# Patient Record
Sex: Female | Born: 1940 | Race: White | Hispanic: No | Smoking: Former smoker
Health system: Southern US, Community
[De-identification: ages and names within clinical notes are randomized; demographics above are authoritative.]

## PROBLEM LIST (undated history)

## (undated) DIAGNOSIS — I519 Heart disease, unspecified: Secondary | ICD-10-CM

## (undated) DIAGNOSIS — F419 Anxiety disorder, unspecified: Secondary | ICD-10-CM

## (undated) DIAGNOSIS — E7800 Pure hypercholesterolemia, unspecified: Secondary | ICD-10-CM

## (undated) DIAGNOSIS — M1990 Unspecified osteoarthritis, unspecified site: Secondary | ICD-10-CM

## (undated) DIAGNOSIS — I10 Essential (primary) hypertension: Secondary | ICD-10-CM

## (undated) DIAGNOSIS — F32A Depression, unspecified: Secondary | ICD-10-CM

## (undated) DIAGNOSIS — F329 Major depressive disorder, single episode, unspecified: Secondary | ICD-10-CM

## (undated) DIAGNOSIS — K219 Gastro-esophageal reflux disease without esophagitis: Secondary | ICD-10-CM

## (undated) HISTORY — PX: OTHER SURGICAL HISTORY: SHX169

## (undated) HISTORY — PX: FOOT SURGERY: SHX648

## (undated) HISTORY — DX: Heart disease, unspecified: I51.9

## (undated) HISTORY — PX: TONSILLECTOMY: SUR1361

## (undated) HISTORY — PX: CERVICAL LAMINECTOMY: SHX94

## (undated) HISTORY — PX: NECK SURGERY: SHX720

## (undated) HISTORY — PX: EYE SURGERY: SHX253

## (undated) HISTORY — DX: Anxiety disorder, unspecified: F41.9

## (undated) HISTORY — DX: Pure hypercholesterolemia, unspecified: E78.00

---

## 1969-04-18 HISTORY — PX: TONSILLECTOMY: SUR1361

## 1972-08-18 HISTORY — PX: ABDOMINAL HYSTERECTOMY: SHX81

## 1979-04-19 HISTORY — PX: CERVICAL LAMINECTOMY: SHX94

## 1989-04-18 HISTORY — PX: OTHER SURGICAL HISTORY: SHX169

## 2001-08-18 HISTORY — PX: OTHER SURGICAL HISTORY: SHX169

## 2002-11-11 ENCOUNTER — Encounter: Admission: RE | Admit: 2002-11-11 | Discharge: 2002-11-11 | Payer: Self-pay | Admitting: Neurosurgery

## 2002-11-11 ENCOUNTER — Encounter: Payer: Self-pay | Admitting: Neurosurgery

## 2002-12-21 ENCOUNTER — Encounter: Admission: RE | Admit: 2002-12-21 | Discharge: 2002-12-21 | Payer: Self-pay | Admitting: General Surgery

## 2002-12-21 ENCOUNTER — Encounter: Payer: Self-pay | Admitting: General Surgery

## 2003-04-11 ENCOUNTER — Encounter: Admission: RE | Admit: 2003-04-11 | Discharge: 2003-07-10 | Payer: Self-pay | Admitting: Orthopedic Surgery

## 2004-06-20 ENCOUNTER — Ambulatory Visit (HOSPITAL_BASED_OUTPATIENT_CLINIC_OR_DEPARTMENT_OTHER): Admission: RE | Admit: 2004-06-20 | Discharge: 2004-06-20 | Payer: Self-pay | Admitting: Orthopedic Surgery

## 2004-06-20 ENCOUNTER — Ambulatory Visit (HOSPITAL_COMMUNITY): Admission: RE | Admit: 2004-06-20 | Discharge: 2004-06-20 | Payer: Self-pay | Admitting: Orthopedic Surgery

## 2004-07-09 ENCOUNTER — Encounter: Admission: RE | Admit: 2004-07-09 | Discharge: 2004-10-07 | Payer: Self-pay | Admitting: Orthopedic Surgery

## 2004-07-22 ENCOUNTER — Ambulatory Visit: Payer: Self-pay | Admitting: Family Medicine

## 2005-03-04 ENCOUNTER — Ambulatory Visit: Payer: Self-pay | Admitting: Family Medicine

## 2005-06-11 ENCOUNTER — Ambulatory Visit: Payer: Self-pay | Admitting: Family Medicine

## 2005-06-13 ENCOUNTER — Ambulatory Visit: Payer: Self-pay | Admitting: Family Medicine

## 2005-10-16 ENCOUNTER — Ambulatory Visit: Payer: Self-pay | Admitting: Family Medicine

## 2006-12-18 ENCOUNTER — Ambulatory Visit (HOSPITAL_COMMUNITY): Payer: Self-pay | Admitting: Psychiatry

## 2007-02-04 ENCOUNTER — Ambulatory Visit (HOSPITAL_BASED_OUTPATIENT_CLINIC_OR_DEPARTMENT_OTHER): Admission: RE | Admit: 2007-02-04 | Discharge: 2007-02-04 | Payer: Self-pay | Admitting: Orthopedic Surgery

## 2007-02-10 ENCOUNTER — Ambulatory Visit (HOSPITAL_COMMUNITY): Payer: Self-pay | Admitting: Psychiatry

## 2007-02-22 ENCOUNTER — Encounter: Admission: RE | Admit: 2007-02-22 | Discharge: 2007-03-22 | Payer: Self-pay | Admitting: Orthopedic Surgery

## 2007-03-17 ENCOUNTER — Ambulatory Visit (HOSPITAL_COMMUNITY): Payer: Self-pay | Admitting: Licensed Clinical Social Worker

## 2008-10-30 ENCOUNTER — Ambulatory Visit: Payer: Self-pay | Admitting: Gastroenterology

## 2008-11-13 ENCOUNTER — Ambulatory Visit: Payer: Self-pay | Admitting: Gastroenterology

## 2009-04-27 ENCOUNTER — Ambulatory Visit (HOSPITAL_COMMUNITY): Payer: Self-pay | Admitting: Licensed Clinical Social Worker

## 2009-05-14 ENCOUNTER — Ambulatory Visit (HOSPITAL_COMMUNITY): Payer: Self-pay | Admitting: Licensed Clinical Social Worker

## 2009-05-25 ENCOUNTER — Ambulatory Visit (HOSPITAL_COMMUNITY): Payer: Self-pay | Admitting: Psychiatry

## 2009-06-01 ENCOUNTER — Ambulatory Visit (HOSPITAL_COMMUNITY): Payer: Self-pay | Admitting: Licensed Clinical Social Worker

## 2009-06-15 ENCOUNTER — Ambulatory Visit (HOSPITAL_COMMUNITY): Payer: Self-pay | Admitting: Licensed Clinical Social Worker

## 2009-06-26 ENCOUNTER — Ambulatory Visit (HOSPITAL_COMMUNITY): Payer: Self-pay | Admitting: Psychiatry

## 2009-07-02 ENCOUNTER — Ambulatory Visit (HOSPITAL_COMMUNITY): Payer: Self-pay | Admitting: Licensed Clinical Social Worker

## 2009-07-25 ENCOUNTER — Ambulatory Visit (HOSPITAL_COMMUNITY): Payer: Self-pay | Admitting: Psychiatry

## 2009-08-09 ENCOUNTER — Ambulatory Visit (HOSPITAL_COMMUNITY): Payer: Self-pay | Admitting: Licensed Clinical Social Worker

## 2009-08-22 ENCOUNTER — Ambulatory Visit (HOSPITAL_COMMUNITY): Payer: Self-pay | Admitting: Psychiatry

## 2009-09-03 ENCOUNTER — Ambulatory Visit (HOSPITAL_COMMUNITY): Payer: Self-pay | Admitting: Licensed Clinical Social Worker

## 2009-09-18 ENCOUNTER — Ambulatory Visit (HOSPITAL_COMMUNITY): Payer: Self-pay | Admitting: Licensed Clinical Social Worker

## 2009-10-08 ENCOUNTER — Ambulatory Visit (HOSPITAL_COMMUNITY): Payer: Self-pay | Admitting: Licensed Clinical Social Worker

## 2009-10-22 ENCOUNTER — Ambulatory Visit (HOSPITAL_COMMUNITY): Payer: Self-pay | Admitting: Licensed Clinical Social Worker

## 2009-10-26 ENCOUNTER — Ambulatory Visit (HOSPITAL_COMMUNITY): Payer: Self-pay | Admitting: Psychiatry

## 2009-11-06 ENCOUNTER — Ambulatory Visit (HOSPITAL_COMMUNITY): Payer: Self-pay | Admitting: Licensed Clinical Social Worker

## 2009-11-21 ENCOUNTER — Ambulatory Visit (HOSPITAL_COMMUNITY): Payer: Self-pay | Admitting: Licensed Clinical Social Worker

## 2009-11-27 ENCOUNTER — Ambulatory Visit (HOSPITAL_COMMUNITY): Payer: Self-pay | Admitting: Physician Assistant

## 2009-11-28 ENCOUNTER — Ambulatory Visit (HOSPITAL_COMMUNITY): Payer: Self-pay | Admitting: Licensed Clinical Social Worker

## 2009-12-21 ENCOUNTER — Ambulatory Visit (HOSPITAL_COMMUNITY): Payer: Self-pay | Admitting: Psychiatry

## 2010-04-17 ENCOUNTER — Ambulatory Visit (HOSPITAL_COMMUNITY): Payer: Self-pay | Admitting: Psychiatry

## 2010-08-18 HISTORY — PX: KNEE ARTHROSCOPY: SUR90

## 2010-08-21 ENCOUNTER — Ambulatory Visit (HOSPITAL_COMMUNITY)
Admission: RE | Admit: 2010-08-21 | Discharge: 2010-08-21 | Payer: Self-pay | Source: Home / Self Care | Attending: Licensed Clinical Social Worker | Admitting: Licensed Clinical Social Worker

## 2010-08-21 ENCOUNTER — Ambulatory Visit (HOSPITAL_COMMUNITY): Admit: 2010-08-21 | Payer: Self-pay | Admitting: Licensed Clinical Social Worker

## 2010-08-30 ENCOUNTER — Ambulatory Visit (HOSPITAL_COMMUNITY)
Admission: RE | Admit: 2010-08-30 | Discharge: 2010-08-30 | Payer: Self-pay | Source: Home / Self Care | Attending: Psychiatry | Admitting: Psychiatry

## 2010-09-05 ENCOUNTER — Ambulatory Visit (HOSPITAL_COMMUNITY): Admit: 2010-09-05 | Payer: Self-pay | Admitting: Licensed Clinical Social Worker

## 2010-09-06 ENCOUNTER — Ambulatory Visit (HOSPITAL_COMMUNITY)
Admission: RE | Admit: 2010-09-06 | Discharge: 2010-09-06 | Payer: Self-pay | Source: Home / Self Care | Attending: Licensed Clinical Social Worker | Admitting: Licensed Clinical Social Worker

## 2010-09-11 ENCOUNTER — Ambulatory Visit (HOSPITAL_COMMUNITY)
Admission: RE | Admit: 2010-09-11 | Discharge: 2010-09-11 | Payer: Self-pay | Source: Home / Self Care | Attending: Licensed Clinical Social Worker | Admitting: Licensed Clinical Social Worker

## 2010-09-16 ENCOUNTER — Ambulatory Visit (HOSPITAL_COMMUNITY): Admit: 2010-09-16 | Payer: Self-pay | Admitting: Licensed Clinical Social Worker

## 2010-09-17 ENCOUNTER — Ambulatory Visit (HOSPITAL_COMMUNITY)
Admission: RE | Admit: 2010-09-17 | Discharge: 2010-09-17 | Payer: Self-pay | Source: Home / Self Care | Attending: Licensed Clinical Social Worker | Admitting: Licensed Clinical Social Worker

## 2010-09-23 ENCOUNTER — Encounter (HOSPITAL_COMMUNITY): Payer: Medicare Other | Admitting: Licensed Clinical Social Worker

## 2010-10-01 ENCOUNTER — Encounter (HOSPITAL_COMMUNITY): Payer: Medicare Other | Admitting: Licensed Clinical Social Worker

## 2010-10-01 DIAGNOSIS — F332 Major depressive disorder, recurrent severe without psychotic features: Secondary | ICD-10-CM

## 2010-10-07 ENCOUNTER — Encounter (HOSPITAL_COMMUNITY): Payer: Self-pay | Admitting: Licensed Clinical Social Worker

## 2010-10-10 ENCOUNTER — Encounter (INDEPENDENT_AMBULATORY_CARE_PROVIDER_SITE_OTHER): Payer: Medicare Other | Admitting: Licensed Clinical Social Worker

## 2010-10-10 DIAGNOSIS — F332 Major depressive disorder, recurrent severe without psychotic features: Secondary | ICD-10-CM

## 2010-10-30 ENCOUNTER — Encounter (HOSPITAL_COMMUNITY): Payer: Medicare Other | Admitting: Licensed Clinical Social Worker

## 2010-10-30 DIAGNOSIS — F332 Major depressive disorder, recurrent severe without psychotic features: Secondary | ICD-10-CM

## 2010-11-11 ENCOUNTER — Encounter (HOSPITAL_COMMUNITY): Payer: BC Managed Care – PPO | Admitting: Licensed Clinical Social Worker

## 2010-11-11 DIAGNOSIS — F332 Major depressive disorder, recurrent severe without psychotic features: Secondary | ICD-10-CM

## 2010-11-18 ENCOUNTER — Encounter (HOSPITAL_COMMUNITY): Payer: BC Managed Care – PPO | Admitting: Licensed Clinical Social Worker

## 2010-11-19 ENCOUNTER — Encounter (HOSPITAL_COMMUNITY): Payer: Self-pay | Admitting: Physician Assistant

## 2010-11-27 ENCOUNTER — Encounter (HOSPITAL_COMMUNITY): Payer: BC Managed Care – PPO | Admitting: Licensed Clinical Social Worker

## 2010-11-27 DIAGNOSIS — F332 Major depressive disorder, recurrent severe without psychotic features: Secondary | ICD-10-CM

## 2010-12-11 ENCOUNTER — Encounter (HOSPITAL_COMMUNITY): Payer: BC Managed Care – PPO | Admitting: Licensed Clinical Social Worker

## 2010-12-25 ENCOUNTER — Encounter (HOSPITAL_COMMUNITY): Payer: BC Managed Care – PPO | Admitting: Licensed Clinical Social Worker

## 2010-12-31 NOTE — Op Note (Signed)
NAME:  LENIYAH, MARTELL             ACCOUNT NO.:  [id number]   MEDICAL RECORD NO.:  [id number]          PATIENT TYPE:  AMB   LOCATION:  DSC                          FACILITY:  [facility]   PHYSICIAN:  Rodney A. Mortenson, M.D.DATE OF BIRTH:  [birth year]   DATE OF PROCEDURE:  02/04/2007  DATE OF DISCHARGE:                               OPERATIVE REPORT   JUSTIFICATION:  A 70 year old female with a 82-month history of pain  about the left knee.  Activity increase her pain and discomfort.  No  injury.  A slight valgus deformity in the knee, 1+ effusion, acute  tenderness along the medial joint line especially at the posterior  medial corner.  Forward leg extension is very painful.  Weightbearing  film shows some narrowing in the lateral compartment and small marginal  osteophytes at tibial plateau.  Because of persistent pain and  discomfort, it is felt that arthroscopic evaluation and treatment was  indicated necessary.  Complications discussed preoperatively.  Questions  answered and encouraged.   JUSTIFICATION OF OUTPATIENT SURGERY:  Minimum morbidity.   PREOPERATIVE DIAGNOSIS:  Internal derangement, left knee.   POSTOPERATIVE DIAGNOSIS:  Complex tear posterior horn medial meniscus;  tear posterior horn lateral meniscus; osteoarthritis lateral tibial  plateau; chondromalacia of patella.   OPERATION:  Partial medial and lateral meniscectomy, left knee;  chondroplasty of left patella.   SURGEON:  Lenard Galloway. Chaney Malling, M.D.   ANESTHESIA:  Monitored anesthesia care followed by general.   PATHOLOGY:  We arthroscoped the knee.  A very careful examination of the  knee was undertaken.  There was grade 2 and grade 3 chondromalacia of  the patella but the femoral notch appeared fairly normal.  In the  chondral notch area, the ACL was intact.  In the lateral compartment,  there was a tear of the posterior horn of the lateral meniscus and  underneath the mid 1/3 of the lateral meniscus, there  was an area of  total loss of articular cartilage.  In the medial compartments, a  complex tear of the posterior horn of the medial meniscus.   PROCEDURE:  The patient was placed on the operative table in the supine  position with the pneumatic tourniquet brought up the left upper thigh.  The entire left lower extremity prepped with DuraPrep and draped out in  the usual manner.  An infusion cannula was placed in the superior medial  pouch and knee distended with saline.  Anterior-medial and anterior-  lateral portals were made and the arthroscope was introduced.   Attention was first turned to the patella.  Using a chondroplastic shear  over the posterior aspect of the patella, both the medial and lateral  patella recess was debrided with fairly stable cartilage.   Attention then turned to the lateral compartment.  There was a tear of  the posterior horn lateral meniscus, which was debrided with baskets  through both medial and lateral portals.  This was followed by the  intraarticular shaver and this portion of the meniscus was then smoothed  and balanced.  Underneath the mid 1/3 of the lateral meniscus, there was  flaking  and tearing of the articular cartilage off the tibial plateau  and this loose cartilage was then removed.   Attention then turned to the medial compartment.  There was a very  complex tear of the posterior horn of the medial meniscus and this was  debrided through both portals with a series of baskets followed by the  intraarticular shear.  Excellent decompression was also achieved in this  area with nice transition to fairly normal meniscus in the mid 1/3 of  the medial meniscus.  The knee was then filled with Marcaine.  A large,  bulky compression dressing applied.  The patient returned to the  recovery room in excellent condition, checked and this went extremely  well.   DISPOSITION:  1. Percocet for pain.  2. Return to my office on Wednesday.  3. Usual  postoperative instructions were given.           ______________________________  Lenard Galloway. Chaney Malling, M.D.     RAM/MEDQ  D:  02/04/2007  T:  02/04/2007  Job:  [id number]

## 2011-01-03 NOTE — Op Note (Signed)
NAME:  Maria George, Maria George             ACCOUNT NO.:  [id number]   MEDICAL RECORD NO.:  [id number]          PATIENT TYPE:  AMB   LOCATION:  DSC                          FACILITY:  [facility]   PHYSICIAN:  Rodney A. Mortenson, M.D.DATE OF BIRTH:  [birth year]   DATE OF PROCEDURE:  06/20/2004  DATE OF DISCHARGE:                                 OPERATIVE REPORT   PREOPERATIVE DIAGNOSIS:  Massive rotator cuff tear, left shoulder, with 80%  uncoverage.   POSTOPERATIVE DIAGNOSIS:  Massive rotator cuff tear, left shoulder, with 80%  uncoverage of the humeral head.   OPERATION:  Diagnostic arthroscopy of the left shoulder; open acromioplasty;  side-to-side repair of massive tear rotator cuff left shoulder.   SURGEON:  Lenard Galloway. Chaney Malling, M.D.   ASSISTANT:  Joan Mayans   ANESTHESIA:  General.   PROCEDURE:  The patient was placed on the operating table in supine  position.  After satisfactory oral endotracheal anesthesia, the patient was  placed in a semi-seated position.  The left upper extremity was prepped with  DuraPrep and draped out in the usual manner.  Through the standard posterior  portal, the arthroscope was introduced.  A very careful examination of the  glenohumeral joint was done.  There was a massive tear of the rotator cuff  and from the glenohumeral joint, one could observe the subacromial space.  The biceps was frayed in the mid portion but its attachment was intact.  The  articular cartilage over the humeral head and the glenoid appeared normal as  did the anterior glenoid labrum.  There was a massive tear of the rotator  cuff and this was retracted proximally to the lip of the glenoid medially.  The arthroscope was removed.   A saber cut incision was made over the anterolateral aspect of the shoulder.  The skin edges were retracted, bleeders were coagulated.  The deltoid fibers  were released off the anterior aspect of the acromion only.  The subacromial  bursa was excised.   Using a power saw, a small acromioplasty was done which  gave better access to the shoulder joint.  Once this was accomplished, the  bursa was removed.  There was a massive tear of the rotator cuff and about  80% or the head was uncovered.  There were two leaves to the rotator cuff  anterior and posterior which opened up in a V-fashion.  The edges of the  rotator cuff could be mobilized to the midline and excellent coverage of the  head was achieved.  Using a series of interrupted mattress sutures starting  distal and working proximal, the entire tear was brought side-to-side and  excellent water tight closure was achieved.  There was good integrity of the  tissues once this was accomplished.  Throughout the procedure, the shoulder  was copiously irrigated with saline solution.  The deltoid fibers were then  reattached with 0 Vicryl, 2-0 Vicryl was used to close the subcutaneous  tissue, and stainless steel staples were used to close the skin.  A sterile  dressing was applied.  The patient returned to the recovery room in  excellent condition.  Technically, this procedure went extremely well.  I  was very pleased with the surgical outcome.   FOLLOW UP CARE:  1.  [facility].  2.  To my office on Wednesday.  3.  Sling left arm.  4.  Percocet for pain.       RAM/MEDQ  D:  06/20/2004  T:  06/20/2004  Job:  [id number]

## 2011-01-16 ENCOUNTER — Encounter (HOSPITAL_COMMUNITY): Payer: Medicare Other | Admitting: Physician Assistant

## 2011-01-16 DIAGNOSIS — F332 Major depressive disorder, recurrent severe without psychotic features: Secondary | ICD-10-CM

## 2011-01-22 ENCOUNTER — Encounter (HOSPITAL_COMMUNITY): Payer: Medicare Other | Admitting: Physician Assistant

## 2011-01-30 ENCOUNTER — Encounter (HOSPITAL_COMMUNITY): Payer: Medicare Other | Admitting: Physician Assistant

## 2011-01-31 ENCOUNTER — Encounter (HOSPITAL_COMMUNITY): Payer: Medicare Other | Admitting: Physician Assistant

## 2011-04-01 ENCOUNTER — Encounter (HOSPITAL_COMMUNITY): Payer: Medicare Other | Admitting: Physician Assistant

## 2011-04-19 HISTORY — PX: TOTAL KNEE ARTHROPLASTY: SHX125

## 2011-04-28 ENCOUNTER — Other Ambulatory Visit: Payer: Self-pay | Admitting: Orthopedic Surgery

## 2011-04-28 ENCOUNTER — Encounter (HOSPITAL_COMMUNITY): Payer: Medicare Other

## 2011-04-28 LAB — CBC
HCT: 39.3 % (ref 36.0–46.0)
Hemoglobin: 13.2 g/dL (ref 12.0–15.0)
MCH: 31.7 pg (ref 26.0–34.0)
MCHC: 33.6 g/dL (ref 30.0–36.0)
MCV: 94.2 fL (ref 78.0–100.0)
Platelets: 262 10*3/uL (ref 150–400)
RBC: 4.17 MIL/uL (ref 3.87–5.11)
RDW: 12.4 % (ref 11.5–15.5)
WBC: 8.9 10*3/uL (ref 4.0–10.5)

## 2011-04-28 LAB — URINALYSIS, ROUTINE W REFLEX MICROSCOPIC
Bilirubin Urine: NEGATIVE
Glucose, UA: NEGATIVE mg/dL
Hgb urine dipstick: NEGATIVE
Ketones, ur: NEGATIVE mg/dL
Leukocytes, UA: NEGATIVE
Nitrite: NEGATIVE
Protein, ur: NEGATIVE mg/dL
Specific Gravity, Urine: 1.014 (ref 1.005–1.030)
Urobilinogen, UA: 0.2 mg/dL (ref 0.0–1.0)
pH: 6.5 (ref 5.0–8.0)

## 2011-04-28 LAB — COMPREHENSIVE METABOLIC PANEL
ALT: 27 U/L (ref 0–35)
AST: 24 U/L (ref 0–37)
Albumin: 3.7 g/dL (ref 3.5–5.2)
Alkaline Phosphatase: 103 U/L (ref 39–117)
CO2: 31 mEq/L (ref 19–32)
Calcium: 9.9 mg/dL (ref 8.4–10.5)
Chloride: 98 mEq/L (ref 96–112)
Creatinine, Ser: 0.97 mg/dL (ref 0.50–1.10)
GFR calc Af Amer: >60 mL/min (ref >60)
GFR calc non Af Amer: 57 mL/min — ABNORMAL LOW (ref >60)
Glucose, Bld: 80 mg/dL (ref 70–99)
Potassium: 4.3 mEq/L (ref 3.5–5.1)
Sodium: 135 mEq/L (ref 135–145)
Total Bilirubin: 0.2 mg/dL — ABNORMAL LOW (ref 0.3–1.2)
Total Protein: 7.3 g/dL (ref 6.0–8.3)

## 2011-04-28 LAB — PROTIME-INR: INR: 0.9 (ref 0.00–1.49)

## 2011-04-28 LAB — SURGICAL PCR SCREEN
MRSA, PCR: NEGATIVE
Staphylococcus aureus: NEGATIVE

## 2011-05-05 ENCOUNTER — Inpatient Hospital Stay (HOSPITAL_COMMUNITY)
Admission: RE | Admit: 2011-05-05 | Discharge: 2011-05-08 | DRG: 470 | Disposition: A | Discharge status: Skilled Nursing Facility | Payer: Medicare Other | Source: Ambulatory Visit | Attending: Orthopedic Surgery | Admitting: Orthopedic Surgery

## 2011-05-05 DIAGNOSIS — F329 Major depressive disorder, single episode, unspecified: Secondary | ICD-10-CM | POA: Diagnosis present

## 2011-05-05 DIAGNOSIS — Z79899 Other long term (current) drug therapy: Secondary | ICD-10-CM

## 2011-05-05 DIAGNOSIS — D62 Acute posthemorrhagic anemia: Secondary | ICD-10-CM | POA: Diagnosis not present

## 2011-05-05 DIAGNOSIS — F3289 Other specified depressive episodes: Secondary | ICD-10-CM | POA: Diagnosis present

## 2011-05-05 DIAGNOSIS — I10 Essential (primary) hypertension: Secondary | ICD-10-CM | POA: Diagnosis present

## 2011-05-05 DIAGNOSIS — M810 Age-related osteoporosis without current pathological fracture: Secondary | ICD-10-CM | POA: Diagnosis present

## 2011-05-05 DIAGNOSIS — E871 Hypo-osmolality and hyponatremia: Secondary | ICD-10-CM | POA: Diagnosis not present

## 2011-05-05 DIAGNOSIS — M1710 Unilateral primary osteoarthritis, unspecified knee: Principal | ICD-10-CM | POA: Diagnosis present

## 2011-05-05 DIAGNOSIS — Z01812 Encounter for preprocedural laboratory examination: Secondary | ICD-10-CM

## 2011-05-05 DIAGNOSIS — IMO0002 Reserved for concepts with insufficient information to code with codable children: Secondary | ICD-10-CM | POA: Diagnosis present

## 2011-05-05 LAB — TYPE AND SCREEN

## 2011-05-06 LAB — BASIC METABOLIC PANEL
CO2: 28 mEq/L (ref 19–32)
Chloride: 98 mEq/L (ref 96–112)
Glucose, Bld: 134 mg/dL — ABNORMAL HIGH (ref 70–99)
Potassium: 4.8 mEq/L (ref 3.5–5.1)
Sodium: 134 mEq/L — ABNORMAL LOW (ref 135–145)

## 2011-05-06 LAB — CBC
Hemoglobin: 10.1 g/dL — ABNORMAL LOW (ref 12.0–15.0)
MCH: 31.9 pg (ref 26.0–34.0)
RBC: 3.17 MIL/uL — ABNORMAL LOW (ref 3.87–5.11)

## 2011-05-07 LAB — BASIC METABOLIC PANEL
BUN: 15 mg/dL (ref 6–23)
Chloride: 103 mEq/L (ref 96–112)
GFR calc Af Amer: >60 mL/min (ref >60)
Glucose, Bld: 113 mg/dL — ABNORMAL HIGH (ref 70–99)
Potassium: 3.9 mEq/L (ref 3.5–5.1)
Sodium: 136 mEq/L (ref 135–145)

## 2011-05-07 LAB — CBC
HCT: 27 % — ABNORMAL LOW (ref 36.0–46.0)
Hemoglobin: 9 g/dL — ABNORMAL LOW (ref 12.0–15.0)
RDW: 13.2 % (ref 11.5–15.5)
WBC: 10.2 10*3/uL (ref 4.0–10.5)

## 2011-05-07 NOTE — Op Note (Signed)
NAMESHERLYN, EBBERT NO.:  [id number]  MEDICAL RECORD NO.:  [id number]  LOCATION:  1605                         FACILITY:  [facility]  PHYSICIAN:  Ollen Gross, M.D.    DATE OF BIRTH:  24-Aug-1940  DATE OF PROCEDURE:  05/05/2011 DATE OF DISCHARGE:                              OPERATIVE REPORT   PREOPERATIVE DIAGNOSIS:  Osteoarthritis, left knee.  POSTOPERATIVE DIAGNOSIS:  Osteoarthritis, left knee.  PROCEDURE:  Left total knee arthroplasty.  SURGEON:  Ollen Gross, M.D.  ASSISTANT:  Alexzandrew L. Julien Girt, PA-C  ANESTHESIA:  General.  ESTIMATED BLOOD LOSS:  Minimal.  DRAIN:  Hemovac x1.  TOURNIQUET TIME:  34 minutes at 300 mmHg.  COMPLICATIONS:  None.  CONDITION:  Stable, recovering.  BRIEF CLINICAL NOTE:  [name] is a 70 year old female with advanced end-stage arthritis of the left knee with progressively worsening pain and dysfunction.  She has failed nonoperative management including injections, hence she presents for left total knee arthroplasty.  PROCEDURE IN DETAIL:  After successful administration of general anesthetic, a tourniquet placed high on her left thigh on her left lower extremity, was prepped and draped in usual sterile fashion.  Extremities wrapped in Esmarch, knee flexed, tourniquet inflated to 300 mmHg. Midline incision was made with a #10 blade through subcutaneous tissue to the level of the extensor mechanism.  A fresh blade is used make a medial parapatellar arthrotomy.  Soft tissue on the proximal medial tibia is subperiosteally elevated to the joint line with a knife into the semimembranosus bursa with a Cobb elevator.  Soft tissue laterally was elevated with attention being paid to avoid the patellar tendon and tibial tubercle.  Patella was everted, knee flexed 90 degrees, ACL and PCL removed.  There was bone-on-bone change in the lateral compartment and significant chondromalacia medial and patellofemoral with  cartilage loss.  Drill was used to create a starting hole in the distal femur and canal was thoroughly irrigated.  The 5-degree left valgus alignment guide was placed and distal femoral cutting block pinned to remove 10 mm off the distal femur.  Distal femoral resection is made with an oscillating saw.  Tibia subluxed forward and the menisci removed.  Extramedullary tibial alignment guide is placed referencing proximally to medial aspect of the tibial tubercle and distally along the second metatarsal axis and tibial crest.  The block is pinned to remove 2 mm off the more deficient lateral side.  Tibial resection is made with an oscillating saw.  A size 2.5 is most appropriate and the proximal tibia was repaired with a modular drill and keel punch for the size 2.5.  Femoral sizing guide was placed, size 3 is most appropriate.  Rotation was marked to the epicondylar axis and confirmed by creating rectangular flexion gap at 90 degrees.  The block is pinned in its rotation and the anterior-posterior chamfer cuts were made.  Intercondylar block was placed in that, cuts made.  Trial size 3 posterior stabilized femoral component is placed.  12.5 mm posterior stabilized rotating platform insert trial was placed.  With a 12.5, full extensions achieved with excellent varus-valgus and anterior-posterior balance throughout full range of motion.  The patella was everted, thickness measured  to be 22 mm.  Freehand resection is taken to 13 mm, 35 template is placed, lug holes were drilled, trial patella was placed and it tracks normally. Osteophytes removed off the posterior femur with the trial in place. All trials are removed and the cut bone surfaces repaired with pulsatile lavage.  Cements mixed and once ready for implantation, a size 2.5 mobile bearing tibial tray size 3, posterior stabilized femur and 35 patella are cemented into place.  Patella was held with a clamp.  The trial 12.5 mm insert  is placed, knee held in full extension, all extruded cement removed.  When the cement is fully hardened, then the permanent 12.5 mm posterior stabilized rotating platform insert is placed in the tibial tray.  Wound is copiously irrigated with saline solution and the arthrotomy closed over Hemovac drain with interrupted #1 PDS.  Flexion against gravity to 135 degrees of patella tracks normally.  Tourniquet was released, total time of 34 minutes.  Subcu was closed with interrupted 2-0 Vicryl, subcuticular running 4-0 Monocryl. Incisions cleaned and dried, and Steri-Strips and bulky sterile dressing were applied.  She is then placed into a knee immobilizer, awakened and transferred to recovery in stable condition.  Please note that it was amendable necessity to have a surgical assistant participated in this case for the safe and expeditious performance of it.  Surgical assistant was essential for providing retraction of ligaments and neurovascular structures and also proper positioning of the leg to gain correct alignment of the prosthesis.     Ollen Gross, M.D.     FA/MEDQ  D:  05/05/2011  T:  05/05/2011  Job:  [id number]  Electronically Signed by Ollen Gross M.D. on 05/07/2011 10:09:38 AM

## 2011-05-08 LAB — CBC
HCT: 25.3 % — ABNORMAL LOW (ref 36.0–46.0)
Hemoglobin: 8.5 g/dL — ABNORMAL LOW (ref 12.0–15.0)
MCHC: 33.6 g/dL (ref 30.0–36.0)
RBC: 2.66 MIL/uL — ABNORMAL LOW (ref 3.87–5.11)

## 2011-05-14 NOTE — Discharge Summary (Signed)
Maria George, LOOR             ACCOUNT NO.:  [id number]  MEDICAL RECORD NO.:  [id number]  LOCATION:  1605                         FACILITY:  [facility]  PHYSICIAN:  Ollen Gross, M.D.    DATE OF BIRTH:  03-Dec-1940  DATE OF ADMISSION:  05/05/2011 DATE OF DISCHARGE:  05/08/2011                        DISCHARGE SUMMARY - REFERRING   ADMITTING DIAGNOSES: 1. Osteoarthritis, left knee. 2. Hypertension. 3. Depression. 4. Osteoporosis. 5. Degenerative disk disease. 6. Childhood illnesses of measles and mumps.  DISCHARGE DIAGNOSES: 1. Osteoarthritis, left knee status post left total knee replacement     arthroplasty. 2. Postoperative acute blood loss anemia, did not require transfusion. 3. Mild postoperative hyponatremia, improved. 4. Osteoarthritis, left knee. 5. Hypertension. 6. Depression. 7. Osteoporosis. 8. Degenerative disk disease. 9. Childhood illnesses of measles and mumps.  PROCEDURE:  May 05, 2011, left total knee.  Surgeon Dr. Ollen Gross, assistant Alexzandrew L. Perkins, P.A.C.  Anesthesia general. Tourniquet time, 34 minutes.  CONSULTS:  None.  BRIEF HISTORY:  Maria George is a 70 year old female with advanced arthritis of the left knee with progressive worsening pain and dysfunction.  She has failed  nonoperative management including injections and now presents for total knee arthroplasty.  LABORATORY DATA:  Preoperative CBC showed hemoglobin 13.2, hematocrit 39.3, white cell count 8.9, platelets 262.  PT and INR 12.3 and 0.9 with a PTT of 33.  Chem panel on admission:  Slightly low total bilirubin 0.2.  Remaining Chem panel within normal limits.  Preoperative UA negative.  Blood group type A+.  Nasal swabs were negative.  Staph aureus negative for MRSA.  Serial CBCs were followed.  Hemoglobin dropped down to 10.1, then to 9, last H and H was 8.5 and 25.3.  Serial BMETs were followed for 48 hours.  Sodium did drop from 135 to 134, back up to 136.   Remaining electrolytes remained within normal limits.  EKG dated May 05, 2011:  Normal sinus rhythm, normal EKG, unconfirmed.  HOSPITAL COURSE:  The patient admitted to [facility], taken to the OR, underwent the above-stated procedure without complication.  The patient tolerated procedure well, later transferred to recovery room in the orthopedic floor, started on p.o. and IV analgesic pain control following surgery, given 24 hours postop IV antibiotics.  The Hemovac drain placed on the surgery was pulled without difficulty.  She was placed on Xarelto for DVT prophylaxis.  Since a little low __________ dilutional component, but she was diuresing fluids well, just allowed her to concentrate her sodium backup.  Blood pressure was stable.  She started getting up out of bed on day #1.  The patient wanted to look into a rehab facility, so social work involved.  She was walking short distance about 10-12 feet.  By day #2, she was hurting more that morning and is probably because of the therapy.  The dressing was changed on day #2.  Incision looked good.  She wanted to look into a [facility], so we were looking for bed __________.  Hemoglobin was down to 9 and electrolytes were good.  Continued to work with therapy by day #3.  The patient was doing well, stable.  Hemoglobin was 8.5.  She was working  with therapy.  Dressing looked good.  Incision was healing well, felt to be a candidate for inpatient rehab.  This patient will be transferred [facility] to [facility] at that time.  DISCHARGE PLAN:  The patient was referred to [facility] on May 08, 2011.  DISCHARGE DIAGNOSES:  Please see above.  DISCHARGE MEDICATIONS:  Current medications at the time of transfer include: 1. Xarelto 10 mg p.o. daily for 3 weeks, then discontinue the Xarelto. 2. Colace 100 mg p.o. b.i.d. 3. Gabapentin 300 mg 2 tablets twice a day. 4. Klonopin 1 mg p.o. daily. 5. Ambien 10  mg p.o. q.h.s. p.r.n. sleep. 6. Effexor XR 300 mg p.o. daily a.m. 7. Vistaril 100 mg p.o. daily at bedtime. 8. Nu-Iron 150 mg p.o. daily for 3 weeks and discontinue the Nu-Iron. 9. Tylenol 325 one or two every 4-6 hours as needed for mild pain,     temperature, or headache. 10.Robaxin 500 mg p.o. q.6-8 hours p.r.n. spasm. 11.Laxative of choice. 12.Enema of choice. 13.OxyIR 5 mg 1-2 tablets every 4 hours as needed for moderate pain.  DIET:  Heart-healthy diet.  ACTIVITY:  She is weightbearing as tolerated to the left lower extremity total knee protocol.  PT and OT for gait training ambulation, ADLs, range of motion, strengthening exercises for continued total knee protocol.  FOLLOWUP:  The patient is to follow up with Dr. Lequita Halt in the office, 2 weeks from date of surgery.  Please contact the office at [phone] to help arrange appointment transfer and followup for this patient.  Please note the patient may start showering; however, do not submerge incision under water.  DISPOSITION:  [employer].  CONDITION ON DISCHARGE:  Improving.     Alexzandrew L. Julien Girt, P.A.C.   ______________________________ Ollen Gross, M.D.    ALP/MEDQ  D:  05/08/2011  T:  05/08/2011  Job:  [id number]  Electronically Signed by Ollen Gross M.D. on 05/14/2011 10:33:15 AM

## 2011-05-28 NOTE — H&P (Signed)
NAME:  Maria George, Maria George             ACCOUNT NO.:  [id number]  MEDICAL RECORD NO.:  [id number]  LOCATION:                               FACILITY:  [facility]  PHYSICIAN:  Alexzandrew L. Perkins, P.A.C.DATE OF BIRTH:  25-Sep-1940  DATE OF ADMISSION:  05/05/2011 DATE OF DISCHARGE:                             HISTORY & PHYSICAL   CHIEF COMPLAINT:  Left knee pain.  HISTORY OF PRESENT ILLNESS:  The patient is a 70 year old female who has seen by Dr. Lequita Halt for ongoing left knee pain.  She was seen as a second opinion with a history of several years of difficulty.  She has bilateral knee pain but left is more symptomatic and problematic.  It has progressively gotten worse with time.  It is hurting at night, even waking her up.  It is impacting her daily function.  She has had cortisone injections also including viscous supplementation injections. Despite conservative measures including shots/injections, it is felt she would benefit from undergoing surgical intervention.  Risks and benefits have been discussed.  She elects to proceed with surgery.  She has been seen preoperatively by Dr. Charm Barges and felt to be stable.  She does not have any contraindications such as ongoing infection or progressive neurological disease.  ALLERGIES:  No known drug allergies.  CURRENT MEDICATIONS:  Ibuprofen, Neurontin, Effexor, Ambien, lisinopril, Klonopin, and vitamin D.  PAST MEDICAL HISTORY:  Hypertension, depression, osteoporosis, degenerative disk disease.  Childhood illnesses:  Measles and mumps.  PAST SURGICAL HISTORY:  Bilateral knee arthroscopies, cervical laminectomy, lumbar laminectomy, rotator cuff surgery x3, tonsillectomy, and stomach surgery.  FAMILY HISTORY:  Father with history of heart disease and heart attack. Mother with history of breast cancer.  SOCIAL HISTORY:  Divorced, past smoker.  No alcohol.  Lives alone.  She does want to look into possible rehab stay following her hospital  stay.  REVIEW OF SYSTEMS:  GENERAL:  No fevers, chills or night sweats.  NEURO: No seizures, syncope or paralysis.  RESPIRATORY:  No shortness breath, productive cough or hemoptysis.  CARDIOVASCULAR:  No chest pain or orthopnea.  GI:  No nausea, vomiting, diarrhea or  constipation.  GU: No dysuria, hematuria or discharge.  MUSCULOSKELETAL:  Knee pain.  PHYSICAL EXAMINATION:  VITAL SIGNS:  Pulse 88, respirations 12, blood pressure 132/72. GENERAL:  A 70 year old white female, well-nourished, well-developed, in no acute distress.  Slightly overweight.  She is alert, oriented and cooperative, somewhat of a slight flat affect during the exam. HEENT:  Normocephalic, atraumatic.  Pupils, round and reactive.  EOMs intact.  Noted to wear glasses. NECK:  Supple.  No carotid bruits. CHEST:  Clear anterior and posterior chest walls.  No rhonchi, rales or wheezing. HEART:  Regular rate and rhythm.  No murmur, S1, S2 noted. ABDOMEN:  Soft, slightly round.  Bowel sounds present. RECTAL/BREASTS/GENITALIA:  Not done.  Not pertinent to present illness. EXTREMITIES:  Left knee:  No effusion, slight valgus.  Marked crepitus, tender more lateral than medial.  Range of motion 135.  Right knee range of motion 5 to 135.  No instability.  No effusion. Slight valgus, tender more lateral than medial.  She does ambulate with a slow shuffling gait.  IMPRESSION:  Osteoarthritis, left knee.  PLAN:  The patient will be admitted to [facility] to undergo a left total knee replacement arthroplasty.  Surgery will be performed by Dr. Ollen Gross.  Dictated For:  Gus Rankin. Elvan Ebron, MD     Alexzandrew L. Perkins, P.A.C.     ALP/MEDQ  D:  05/04/2011  T:  05/04/2011  Job:  [id number]  cc:   Samuel Jester, MD Fax: [phone]  Electronically Signed by Patrica Duel P.A.C. on 05/08/2011 11:31:33 AM Electronically Signed by Ollen Gross M.D. on 05/28/2011 11:17:22 AM

## 2011-06-04 LAB — BASIC METABOLIC PANEL
CO2: 29
Calcium: 8.7
Chloride: 103
GFR calc Af Amer: >60
Glucose, Bld: 87
Potassium: 3.4 — ABNORMAL LOW
Sodium: 138

## 2011-06-04 LAB — POCT HEMOGLOBIN-HEMACUE: Hemoglobin: 13.8

## 2011-06-23 ENCOUNTER — Ambulatory Visit: Payer: Medicare Other | Attending: Orthopedic Surgery | Admitting: Physical Therapy

## 2011-06-23 DIAGNOSIS — R262 Difficulty in walking, not elsewhere classified: Secondary | ICD-10-CM | POA: Insufficient documentation

## 2011-06-23 DIAGNOSIS — R5381 Other malaise: Secondary | ICD-10-CM | POA: Insufficient documentation

## 2011-06-23 DIAGNOSIS — M25569 Pain in unspecified knee: Secondary | ICD-10-CM | POA: Insufficient documentation

## 2011-06-23 DIAGNOSIS — IMO0001 Reserved for inherently not codable concepts without codable children: Secondary | ICD-10-CM | POA: Insufficient documentation

## 2011-06-23 DIAGNOSIS — M25669 Stiffness of unspecified knee, not elsewhere classified: Secondary | ICD-10-CM | POA: Insufficient documentation

## 2011-06-24 ENCOUNTER — Ambulatory Visit: Payer: Medicare Other | Admitting: Physical Therapy

## 2011-06-26 ENCOUNTER — Ambulatory Visit: Payer: Medicare Other | Admitting: Physical Therapy

## 2011-06-30 ENCOUNTER — Ambulatory Visit: Payer: Medicare Other | Admitting: Physical Therapy

## 2011-07-01 ENCOUNTER — Ambulatory Visit: Payer: Medicare Other | Admitting: *Deleted

## 2011-07-07 ENCOUNTER — Ambulatory Visit: Payer: Medicare Other | Admitting: Physical Therapy

## 2011-07-08 ENCOUNTER — Ambulatory Visit: Payer: Medicare Other | Admitting: *Deleted

## 2011-07-09 ENCOUNTER — Encounter: Payer: Medicare Other | Admitting: Physical Therapy

## 2011-07-14 ENCOUNTER — Ambulatory Visit: Payer: Medicare Other | Admitting: Physical Therapy

## 2011-07-15 ENCOUNTER — Ambulatory Visit: Payer: Medicare Other | Admitting: Physical Therapy

## 2011-07-17 ENCOUNTER — Encounter: Payer: Medicare Other | Admitting: *Deleted

## 2011-07-21 ENCOUNTER — Ambulatory Visit: Payer: Medicare Other | Attending: Orthopedic Surgery | Admitting: Physical Therapy

## 2011-07-21 DIAGNOSIS — R262 Difficulty in walking, not elsewhere classified: Secondary | ICD-10-CM | POA: Insufficient documentation

## 2011-07-21 DIAGNOSIS — R5381 Other malaise: Secondary | ICD-10-CM | POA: Insufficient documentation

## 2011-07-21 DIAGNOSIS — IMO0001 Reserved for inherently not codable concepts without codable children: Secondary | ICD-10-CM | POA: Insufficient documentation

## 2011-07-21 DIAGNOSIS — M25669 Stiffness of unspecified knee, not elsewhere classified: Secondary | ICD-10-CM | POA: Insufficient documentation

## 2011-07-21 DIAGNOSIS — M25569 Pain in unspecified knee: Secondary | ICD-10-CM | POA: Insufficient documentation

## 2011-07-22 ENCOUNTER — Ambulatory Visit: Payer: Medicare Other | Admitting: Physical Therapy

## 2011-07-23 ENCOUNTER — Encounter: Payer: Medicare Other | Admitting: Physical Therapy

## 2011-07-24 ENCOUNTER — Ambulatory Visit: Payer: Medicare Other | Admitting: Physical Therapy

## 2011-07-29 ENCOUNTER — Encounter: Payer: Medicare Other | Admitting: Physical Therapy

## 2011-08-05 ENCOUNTER — Ambulatory Visit: Payer: Medicare Other | Admitting: Physical Therapy

## 2011-08-07 ENCOUNTER — Ambulatory Visit: Payer: Medicare Other | Admitting: Physical Therapy

## 2011-08-14 ENCOUNTER — Encounter: Payer: Medicare Other | Admitting: Physical Therapy

## 2011-08-20 ENCOUNTER — Ambulatory Visit: Payer: Medicare Other | Attending: Orthopedic Surgery | Admitting: Physical Therapy

## 2011-08-20 DIAGNOSIS — R5381 Other malaise: Secondary | ICD-10-CM | POA: Insufficient documentation

## 2011-08-20 DIAGNOSIS — R262 Difficulty in walking, not elsewhere classified: Secondary | ICD-10-CM | POA: Insufficient documentation

## 2011-08-20 DIAGNOSIS — IMO0001 Reserved for inherently not codable concepts without codable children: Secondary | ICD-10-CM | POA: Insufficient documentation

## 2011-08-20 DIAGNOSIS — M25569 Pain in unspecified knee: Secondary | ICD-10-CM | POA: Insufficient documentation

## 2011-08-20 DIAGNOSIS — M25669 Stiffness of unspecified knee, not elsewhere classified: Secondary | ICD-10-CM | POA: Insufficient documentation

## 2011-08-21 ENCOUNTER — Encounter: Payer: Medicare Other | Admitting: *Deleted

## 2011-08-25 ENCOUNTER — Encounter: Payer: Medicare Other | Admitting: Physical Therapy

## 2011-08-26 ENCOUNTER — Ambulatory Visit: Payer: Medicare Other | Admitting: Physical Therapy

## 2011-08-27 ENCOUNTER — Ambulatory Visit: Payer: Medicare Other | Admitting: Physical Therapy

## 2011-09-01 ENCOUNTER — Encounter: Payer: Medicare Other | Admitting: Physical Therapy

## 2011-09-02 ENCOUNTER — Encounter: Payer: Medicare Other | Admitting: Physical Therapy

## 2011-09-08 ENCOUNTER — Encounter (HOSPITAL_COMMUNITY): Payer: Self-pay | Admitting: Pharmacy Technician

## 2011-09-10 ENCOUNTER — Encounter: Payer: Medicare Other | Admitting: Physical Therapy

## 2011-09-15 ENCOUNTER — Encounter (HOSPITAL_COMMUNITY)
Admission: RE | Admit: 2011-09-15 | Discharge: 2011-09-15 | Disposition: A | Discharge status: Home / Self Care | Payer: Medicare Other | Source: Ambulatory Visit | Attending: Orthopedic Surgery | Admitting: Orthopedic Surgery

## 2011-09-15 ENCOUNTER — Encounter (HOSPITAL_COMMUNITY): Payer: Self-pay

## 2011-09-15 HISTORY — DX: Essential (primary) hypertension: I10

## 2011-09-15 HISTORY — DX: Major depressive disorder, single episode, unspecified: F32.9

## 2011-09-15 HISTORY — DX: Depression, unspecified: F32.A

## 2011-09-15 LAB — COMPREHENSIVE METABOLIC PANEL
ALT: 19 U/L (ref 0–35)
Alkaline Phosphatase: 105 U/L (ref 39–117)
BUN: 17 mg/dL (ref 6–23)
CO2: 28 mEq/L (ref 19–32)
Chloride: 102 mEq/L (ref 96–112)
GFR calc Af Amer: 66 mL/min — ABNORMAL LOW (ref >90)
Glucose, Bld: 87 mg/dL (ref 70–99)
Potassium: 3.9 mEq/L (ref 3.5–5.1)
Sodium: 138 mEq/L (ref 135–145)
Total Bilirubin: 0.3 mg/dL (ref 0.3–1.2)
Total Protein: 7.5 g/dL (ref 6.0–8.3)

## 2011-09-15 LAB — CBC
HCT: 39 % (ref 36.0–46.0)
Hemoglobin: 13 g/dL (ref 12.0–15.0)
RBC: 4.27 MIL/uL (ref 3.87–5.11)
WBC: 8 10*3/uL (ref 4.0–10.5)

## 2011-09-15 LAB — URINALYSIS, ROUTINE W REFLEX MICROSCOPIC
Glucose, UA: NEGATIVE mg/dL
Hgb urine dipstick: NEGATIVE
Ketones, ur: NEGATIVE mg/dL
Protein, ur: NEGATIVE mg/dL
pH: 6 (ref 5.0–8.0)

## 2011-09-15 LAB — URINE MICROSCOPIC-ADD ON

## 2011-09-15 LAB — APTT: aPTT: 38 seconds — ABNORMAL HIGH (ref 24–37)

## 2011-09-15 LAB — PROTIME-INR
INR: 0.92 (ref 0.00–1.49)
Prothrombin Time: 12.6 seconds (ref 11.6–15.2)

## 2011-09-15 NOTE — Patient Instructions (Addendum)
20 Maria George  09/15/2011   Your procedure is scheduled on:09-17-2011  Report to [facility] at 0900 AM.  Call this number if you have problems the morning of surgery: [phone]   Remember:  Do not eat food or drink liquids:After Midnight.    .  Take these medicines the morning of surgery with A SIP OF WATER: klonopin in needed, flexeril if needed, effexor   Do not wear jewelry, make-up.  Do not wear lotions, powders, or perfumes. Do not wear deodorant.  Do not shave 48 hours prior to surgery.  Do not bring valuables to the hospital.  Contacts, dentures or bridgework may not be worn into surgery.  Leave suitcase in the car. After surgery it may be brought to your room.  For patients admitted to the hospital, checkout time is 11:00 AM the day of discharge.     Special Instructions: CHG Shower Use Special Wash: 1/2 bottle night before surgery and 1/2 bottle morning of surgery.neck down avoid private area.   Please read over the following fact sheets that you were given: MRSA Information, blood fact sheet  Jasmine December Devarius Nelles rn [facility] pre op nurse call if needed phone number [phone]

## 2011-09-15 NOTE — Pre-Procedure Instructions (Addendum)
ekg 05-05-2011 [facility] on chart Chest 1 view xray 03-22-2011 [facility] on chart Medical clearance note dr Charm Barges on chart

## 2011-09-17 ENCOUNTER — Inpatient Hospital Stay (HOSPITAL_COMMUNITY)
Admission: RE | Admit: 2011-09-17 | Discharge: 2011-09-20 | DRG: 470 | Disposition: A | Discharge status: Skilled Nursing Facility | Payer: Medicare Other | Source: Ambulatory Visit | Attending: Orthopedic Surgery | Admitting: Orthopedic Surgery

## 2011-09-17 ENCOUNTER — Encounter (HOSPITAL_COMMUNITY): Payer: Self-pay | Admitting: Orthopedic Surgery

## 2011-09-17 ENCOUNTER — Other Ambulatory Visit: Payer: Self-pay | Admitting: Orthopedic Surgery

## 2011-09-17 ENCOUNTER — Encounter (HOSPITAL_COMMUNITY)
Admission: RE | Disposition: A | Discharge status: Skilled Nursing Facility | Payer: Self-pay | Source: Ambulatory Visit | Attending: Orthopedic Surgery

## 2011-09-17 ENCOUNTER — Encounter (HOSPITAL_COMMUNITY): Payer: Self-pay | Admitting: Anesthesiology

## 2011-09-17 ENCOUNTER — Encounter (HOSPITAL_COMMUNITY): Payer: Self-pay | Admitting: *Deleted

## 2011-09-17 ENCOUNTER — Inpatient Hospital Stay (HOSPITAL_COMMUNITY): Payer: Medicare Other | Admitting: Anesthesiology

## 2011-09-17 DIAGNOSIS — I10 Essential (primary) hypertension: Secondary | ICD-10-CM | POA: Diagnosis present

## 2011-09-17 DIAGNOSIS — M810 Age-related osteoporosis without current pathological fracture: Secondary | ICD-10-CM | POA: Diagnosis present

## 2011-09-17 DIAGNOSIS — F329 Major depressive disorder, single episode, unspecified: Secondary | ICD-10-CM | POA: Diagnosis present

## 2011-09-17 DIAGNOSIS — Z96659 Presence of unspecified artificial knee joint: Secondary | ICD-10-CM

## 2011-09-17 DIAGNOSIS — M1710 Unilateral primary osteoarthritis, unspecified knee: Principal | ICD-10-CM | POA: Diagnosis present

## 2011-09-17 DIAGNOSIS — Z01812 Encounter for preprocedural laboratory examination: Secondary | ICD-10-CM

## 2011-09-17 DIAGNOSIS — F3289 Other specified depressive episodes: Secondary | ICD-10-CM | POA: Diagnosis present

## 2011-09-17 HISTORY — PX: TOTAL KNEE ARTHROPLASTY: SHX125

## 2011-09-17 LAB — TYPE AND SCREEN: ABO/RH(D): A POS

## 2011-09-17 SURGERY — ARTHROPLASTY, KNEE, TOTAL
Anesthesia: General | Site: Knee | Laterality: Right | Wound class: Clean

## 2011-09-17 MED ORDER — PHENOL 1.4 % MT LIQD
1.0000 | OROMUCOSAL | Status: DC | PRN
  Filled 2011-09-17: qty 177

## 2011-09-17 MED ORDER — HYDROXYZINE HCL 50 MG PO TABS
50.0000 mg | ORAL_TABLET | Freq: Three times a day (TID) | ORAL | Status: DC | PRN
  Filled 2011-09-17: qty 2

## 2011-09-17 MED ORDER — CEFAZOLIN SODIUM 1-5 GM-% IV SOLN
INTRAVENOUS | Status: DC | PRN
  Administered 2011-09-17: 2 g via INTRAVENOUS

## 2011-09-17 MED ORDER — CLONAZEPAM 1 MG PO TABS: 1.0000 mg | ORAL_TABLET | Freq: Two times a day (BID) | ORAL | Status: DC | PRN

## 2011-09-17 MED ORDER — ONDANSETRON HCL 4 MG/2ML IJ SOLN
INTRAMUSCULAR | Status: DC | PRN
  Administered 2011-09-17: 4 mg via INTRAVENOUS

## 2011-09-17 MED ORDER — LACTATED RINGERS IV SOLN
INTRAVENOUS | Status: DC | PRN
  Administered 2011-09-17 (×3): via INTRAVENOUS

## 2011-09-17 MED ORDER — OXYCODONE HCL 5 MG PO TABS
5.0000 mg | ORAL_TABLET | ORAL | Status: DC | PRN
  Administered 2011-09-18 (×2): 5 mg via ORAL
  Administered 2011-09-18: 10 mg via ORAL
  Administered 2011-09-18: 5 mg via ORAL
  Administered 2011-09-19 – 2011-09-20 (×6): 10 mg via ORAL
  Filled 2011-09-17: qty 2
  Filled 2011-09-17 (×2): qty 1
  Filled 2011-09-17 (×4): qty 2
  Filled 2011-09-17: qty 1
  Filled 2011-09-17 (×2): qty 2

## 2011-09-17 MED ORDER — CEFAZOLIN SODIUM-DEXTROSE 2-3 GM-% IV SOLR: 2.0000 g | Freq: Once | INTRAVENOUS | Status: DC

## 2011-09-17 MED ORDER — NALOXONE HCL 0.4 MG/ML IJ SOLN: 0.4000 mg | INTRAMUSCULAR | Status: DC | PRN

## 2011-09-17 MED ORDER — METOCLOPRAMIDE HCL 10 MG PO TABS: 5.0000 mg | ORAL_TABLET | Freq: Three times a day (TID) | ORAL | Status: DC | PRN

## 2011-09-17 MED ORDER — METOCLOPRAMIDE HCL 5 MG/ML IJ SOLN: 5.0000 mg | Freq: Three times a day (TID) | INTRAMUSCULAR | Status: DC | PRN

## 2011-09-17 MED ORDER — METHOCARBAMOL 100 MG/ML IJ SOLN
500.0000 mg | Freq: Four times a day (QID) | INTRAVENOUS | Status: DC | PRN
  Administered 2011-09-17: 500 mg via INTRAVENOUS
  Filled 2011-09-17: qty 5

## 2011-09-17 MED ORDER — DEXAMETHASONE SODIUM PHOSPHATE 10 MG/ML IJ SOLN
INTRAMUSCULAR | Status: DC | PRN
  Administered 2011-09-17: 10 mg via INTRAVENOUS

## 2011-09-17 MED ORDER — DIPHENHYDRAMINE HCL 12.5 MG/5ML PO ELIX: 12.5000 mg | ORAL_SOLUTION | ORAL | Status: DC | PRN

## 2011-09-17 MED ORDER — KETAMINE HCL 50 MG/ML IJ SOLN
INTRAMUSCULAR | Status: DC | PRN
  Administered 2011-09-17: 20 mg via INTRAMUSCULAR
  Administered 2011-09-17: 10 mg via INTRAMUSCULAR

## 2011-09-17 MED ORDER — MORPHINE SULFATE (PF) 1 MG/ML IV SOLN
INTRAVENOUS | Status: DC
  Administered 2011-09-17 (×2): via INTRAVENOUS
  Administered 2011-09-17: 5 mg via INTRAVENOUS
  Administered 2011-09-17: 12 mg via INTRAVENOUS
  Filled 2011-09-17 (×2): qty 25

## 2011-09-17 MED ORDER — VENLAFAXINE HCL ER 150 MG PO CP24
300.0000 mg | ORAL_CAPSULE | Freq: Every day | ORAL | Status: DC
  Administered 2011-09-18 – 2011-09-20 (×3): 300 mg via ORAL
  Filled 2011-09-17 (×3): qty 2

## 2011-09-17 MED ORDER — ONDANSETRON HCL 4 MG/2ML IJ SOLN: 4.0000 mg | Freq: Four times a day (QID) | INTRAMUSCULAR | Status: DC | PRN

## 2011-09-17 MED ORDER — METHOCARBAMOL 500 MG PO TABS
500.0000 mg | ORAL_TABLET | Freq: Four times a day (QID) | ORAL | Status: DC | PRN
  Administered 2011-09-17 – 2011-09-20 (×8): 500 mg via ORAL
  Filled 2011-09-17 (×8): qty 1

## 2011-09-17 MED ORDER — MIDAZOLAM HCL 5 MG/5ML IJ SOLN
INTRAMUSCULAR | Status: DC | PRN
  Administered 2011-09-17: 2 mg via INTRAVENOUS

## 2011-09-17 MED ORDER — FLEET ENEMA 7-19 GM/118ML RE ENEM: 1.0000 | ENEMA | Freq: Once | RECTAL | Status: AC | PRN

## 2011-09-17 MED ORDER — PROMETHAZINE HCL 25 MG/ML IJ SOLN: 6.2500 mg | INTRAMUSCULAR | Status: DC | PRN

## 2011-09-17 MED ORDER — HYDROMORPHONE HCL PF 1 MG/ML IJ SOLN
0.2500 mg | INTRAMUSCULAR | Status: DC | PRN
  Administered 2011-09-17 (×4): 0.5 mg via INTRAVENOUS

## 2011-09-17 MED ORDER — ACETAMINOPHEN 10 MG/ML IV SOLN
INTRAVENOUS | Status: DC | PRN
  Administered 2011-09-17: 1000 mg via INTRAVENOUS

## 2011-09-17 MED ORDER — FENTANYL CITRATE 0.05 MG/ML IJ SOLN
INTRAMUSCULAR | Status: DC | PRN
  Administered 2011-09-17: 25 ug via INTRAVENOUS
  Administered 2011-09-17 (×2): 50 ug via INTRAVENOUS
  Administered 2011-09-17: 100 ug via INTRAVENOUS
  Administered 2011-09-17: 25 ug via INTRAVENOUS
  Administered 2011-09-17: 50 ug via INTRAVENOUS

## 2011-09-17 MED ORDER — ONDANSETRON HCL 4 MG PO TABS: 4.0000 mg | ORAL_TABLET | Freq: Four times a day (QID) | ORAL | Status: DC | PRN

## 2011-09-17 MED ORDER — MENTHOL 3 MG MT LOZG
1.0000 | LOZENGE | OROMUCOSAL | Status: DC | PRN
  Filled 2011-09-17: qty 9

## 2011-09-17 MED ORDER — LIDOCAINE HCL (CARDIAC) 20 MG/ML IV SOLN
INTRAVENOUS | Status: DC | PRN
  Administered 2011-09-17: 60 mg via INTRAVENOUS

## 2011-09-17 MED ORDER — FENTANYL CITRATE 0.05 MG/ML IJ SOLN: INTRAMUSCULAR | Status: DC | PRN

## 2011-09-17 MED ORDER — TEMAZEPAM 15 MG PO CAPS: 15.0000 mg | ORAL_CAPSULE | Freq: Every evening | ORAL | Status: DC | PRN

## 2011-09-17 MED ORDER — ACETAMINOPHEN 325 MG PO TABS: 650.0000 mg | ORAL_TABLET | Freq: Four times a day (QID) | ORAL | Status: DC | PRN

## 2011-09-17 MED ORDER — ACETAMINOPHEN 650 MG RE SUPP: 650.0000 mg | Freq: Four times a day (QID) | RECTAL | Status: DC | PRN

## 2011-09-17 MED ORDER — BUPIVACAINE ON-Q PAIN PUMP (FOR ORDER SET NO CHG)
INJECTION | Status: DC
  Filled 2011-09-17: qty 1

## 2011-09-17 MED ORDER — KCL IN DEXTROSE-NACL 20-5-0.9 MEQ/L-%-% IV SOLN
INTRAVENOUS | Status: DC
  Administered 2011-09-17: 75 mL/h via INTRAVENOUS
  Administered 2011-09-18: 06:00:00 via INTRAVENOUS
  Filled 2011-09-17 (×3): qty 1000

## 2011-09-17 MED ORDER — SUCCINYLCHOLINE CHLORIDE 20 MG/ML IJ SOLN
INTRAMUSCULAR | Status: DC | PRN
  Administered 2011-09-17: 100 mg via INTRAVENOUS

## 2011-09-17 MED ORDER — PROPOFOL 10 MG/ML IV EMUL
INTRAVENOUS | Status: DC | PRN
  Administered 2011-09-17: 150 mg via INTRAVENOUS
  Administered 2011-09-17: 50 mg via INTRAVENOUS

## 2011-09-17 MED ORDER — RIVAROXABAN 10 MG PO TABS
10.0000 mg | ORAL_TABLET | Freq: Every day | ORAL | Status: DC
  Administered 2011-09-18 – 2011-09-20 (×3): 10 mg via ORAL
  Filled 2011-09-17 (×3): qty 1

## 2011-09-17 MED ORDER — HYDROMORPHONE HCL PF 1 MG/ML IJ SOLN
INTRAMUSCULAR | Status: DC | PRN
  Administered 2011-09-17 (×3): 0.5 mg via INTRAVENOUS

## 2011-09-17 MED ORDER — GABAPENTIN 300 MG PO CAPS
600.0000 mg | ORAL_CAPSULE | Freq: Two times a day (BID) | ORAL | Status: DC
  Administered 2011-09-17 – 2011-09-20 (×6): 600 mg via ORAL
  Filled 2011-09-17 (×7): qty 2

## 2011-09-17 MED ORDER — DIPHENHYDRAMINE HCL 12.5 MG/5ML PO ELIX
12.5000 mg | ORAL_SOLUTION | Freq: Four times a day (QID) | ORAL | Status: DC | PRN
  Administered 2011-09-17: 12.5 mg via ORAL
  Filled 2011-09-17 (×2): qty 5

## 2011-09-17 MED ORDER — BISACODYL 10 MG RE SUPP: 10.0000 mg | Freq: Every day | RECTAL | Status: DC | PRN

## 2011-09-17 MED ORDER — BUPIVACAINE 0.25 % ON-Q PUMP SINGLE CATH 300ML
INJECTION | Status: DC | PRN
  Administered 2011-09-17: 300 mL

## 2011-09-17 MED ORDER — POLYETHYLENE GLYCOL 3350 17 G PO PACK
17.0000 g | PACK | Freq: Every day | ORAL | Status: DC | PRN
  Filled 2011-09-17: qty 1

## 2011-09-17 MED ORDER — ACETAMINOPHEN 10 MG/ML IV SOLN
1000.0000 mg | Freq: Four times a day (QID) | INTRAVENOUS | Status: AC
  Administered 2011-09-18 (×3): 1000 mg via INTRAVENOUS
  Filled 2011-09-17 (×5): qty 100

## 2011-09-17 MED ORDER — CEFAZOLIN SODIUM 1-5 GM-% IV SOLN
1.0000 g | Freq: Four times a day (QID) | INTRAVENOUS | Status: AC
  Administered 2011-09-17 – 2011-09-18 (×3): 1 g via INTRAVENOUS
  Filled 2011-09-17 (×3): qty 50

## 2011-09-17 MED ORDER — DOCUSATE SODIUM 100 MG PO CAPS
100.0000 mg | ORAL_CAPSULE | Freq: Two times a day (BID) | ORAL | Status: DC
  Administered 2011-09-17 – 2011-09-20 (×6): 100 mg via ORAL
  Filled 2011-09-17 (×7): qty 1

## 2011-09-17 MED ORDER — SODIUM CHLORIDE 0.9 % IJ SOLN: 9.0000 mL | INTRAMUSCULAR | Status: DC | PRN

## 2011-09-17 MED ORDER — DIPHENHYDRAMINE HCL 50 MG/ML IJ SOLN: 12.5000 mg | Freq: Four times a day (QID) | INTRAMUSCULAR | Status: DC | PRN

## 2011-09-17 MED ORDER — ZOLPIDEM TARTRATE 5 MG PO TABS
5.0000 mg | ORAL_TABLET | Freq: Every day | ORAL | Status: DC
  Administered 2011-09-17 – 2011-09-19 (×3): 5 mg via ORAL
  Filled 2011-09-17 (×3): qty 1

## 2011-09-17 MED ORDER — SODIUM CHLORIDE 0.9 % IR SOLN
Status: DC | PRN
  Administered 2011-09-17: 1000 mL

## 2011-09-17 SURGICAL SUPPLY — 56 items
BAG SPEC THK2 15X12 ZIP CLS (MISCELLANEOUS) ×1
BAG ZIPLOCK 12X15 (MISCELLANEOUS) ×2 IMPLANT
BANDAGE ELASTIC 6 VELCRO ST LF (GAUZE/BANDAGES/DRESSINGS) ×2 IMPLANT
BANDAGE ESMARK 6X9 LF (GAUZE/BANDAGES/DRESSINGS) ×1 IMPLANT
BLADE SAG 18X100X1.27 (BLADE) ×2 IMPLANT
BLADE SAW SGTL 11.0X1.19X90.0M (BLADE) ×2 IMPLANT
BNDG CMPR 9X6 STRL LF SNTH (GAUZE/BANDAGES/DRESSINGS) ×1
BNDG ESMARK 6X9 LF (GAUZE/BANDAGES/DRESSINGS) ×2
BOWL SMART MIX CTS (DISPOSABLE) ×2 IMPLANT
CATH KIT ON-Q SILVERSOAK 5IN (CATHETERS) ×2 IMPLANT
CEMENT HV SMART SET (Cement) ×4 IMPLANT
CLOTH BEACON ORANGE TIMEOUT ST (SAFETY) ×2 IMPLANT
CUFF TOURN SGL QUICK 34 (TOURNIQUET CUFF) ×2
CUFF TRNQT CYL 34X4X40X1 (TOURNIQUET CUFF) ×1 IMPLANT
DRAPE EXTREMITY T 121X128X90 (DRAPE) ×2 IMPLANT
DRAPE POUCH INSTRU U-SHP 10X18 (DRAPES) ×2 IMPLANT
DRAPE U-SHAPE 47X51 STRL (DRAPES) ×2 IMPLANT
DRSG ADAPTIC 3X8 NADH LF (GAUZE/BANDAGES/DRESSINGS) ×2 IMPLANT
DRSG EMULSION OIL 3X16 NADH (GAUZE/BANDAGES/DRESSINGS) ×2 IMPLANT
DRSG PAD ABDOMINAL 8X10 ST (GAUZE/BANDAGES/DRESSINGS) ×2 IMPLANT
DURAPREP 26ML APPLICATOR (WOUND CARE) ×2 IMPLANT
ELECT REM PT RETURN 9FT ADLT (ELECTROSURGICAL) ×2
ELECTRODE REM PT RTRN 9FT ADLT (ELECTROSURGICAL) ×1 IMPLANT
EVACUATOR 1/8 PVC DRAIN (DRAIN) ×2 IMPLANT
FACESHIELD LNG OPTICON STERILE (SAFETY) ×10 IMPLANT
GLOVE BIO SURGEON STRL SZ7.5 (GLOVE) ×2 IMPLANT
GLOVE BIO SURGEON STRL SZ8 (GLOVE) ×2 IMPLANT
GLOVE BIOGEL PI IND STRL 8 (GLOVE) ×2 IMPLANT
GLOVE BIOGEL PI INDICATOR 8 (GLOVE) ×2
GLOVE SURG SS PI 6.5 STRL IVOR (GLOVE) ×4 IMPLANT
GLOVE SURG SS PI 7.5 STRL IVOR (GLOVE) ×4 IMPLANT
GOWN STRL NON-REIN LRG LVL3 (GOWN DISPOSABLE) ×4 IMPLANT
GOWN STRL REIN XL XLG (GOWN DISPOSABLE) ×4 IMPLANT
HANDPIECE INTERPULSE COAX TIP (DISPOSABLE) ×2
IMMOBILIZER KNEE 20 (SOFTGOODS) ×2
IMMOBILIZER KNEE 20 THIGH 36 (SOFTGOODS) ×1 IMPLANT
KIT BASIN OR (CUSTOM PROCEDURE TRAY) ×2 IMPLANT
MANIFOLD NEPTUNE II (INSTRUMENTS) ×2 IMPLANT
NS IRRIG 1000ML POUR BTL (IV SOLUTION) ×2 IMPLANT
PACK TOTAL JOINT (CUSTOM PROCEDURE TRAY) ×2 IMPLANT
PAD ABD 7.5X8 STRL (GAUZE/BANDAGES/DRESSINGS) ×2 IMPLANT
PADDING CAST COTTON 6X4 STRL (CAST SUPPLIES) ×6 IMPLANT
PADDING WEBRIL 6 STERILE (GAUZE/BANDAGES/DRESSINGS) ×2 IMPLANT
POSITIONER SURGICAL ARM (MISCELLANEOUS) ×2 IMPLANT
SET HNDPC FAN SPRY TIP SCT (DISPOSABLE) ×1 IMPLANT
SPONGE GAUZE 4X4 12PLY (GAUZE/BANDAGES/DRESSINGS) ×2 IMPLANT
STRIP CLOSURE SKIN 1/2X4 (GAUZE/BANDAGES/DRESSINGS) ×4 IMPLANT
SUCTION FRAZIER 12FR DISP (SUCTIONS) ×2 IMPLANT
SUT MNCRL AB 4-0 PS2 18 (SUTURE) ×2 IMPLANT
SUT PDS AB 1 CT1 27 (SUTURE) ×6 IMPLANT
SUT VIC AB 2-0 CT1 27 (SUTURE) ×6
SUT VIC AB 2-0 CT1 TAPERPNT 27 (SUTURE) ×3 IMPLANT
TOWEL OR 17X26 10 PK STRL BLUE (TOWEL DISPOSABLE) ×4 IMPLANT
TRAY FOLEY CATH 14FRSI W/METER (CATHETERS) ×2 IMPLANT
WATER STERILE IRR 1500ML POUR (IV SOLUTION) ×2 IMPLANT
WRAP KNEE MAXI GEL POST OP (GAUZE/BANDAGES/DRESSINGS) ×4 IMPLANT

## 2011-09-17 NOTE — Transfer of Care (Signed)
Immediate Anesthesia Transfer of Care Note  Patient: Maria George  Procedure(s) Performed:  TOTAL KNEE ARTHROPLASTY  Patient Location: PACU  Anesthesia Type: General  Level of Consciousness: sedated and patient cooperative  Airway & Oxygen Therapy: Patient Spontanous Breathing and Patient connected to face mask oxygen  Post-op Assessment: Report given to PACU RN, Post -op Vital signs reviewed and stable and Patient moving all extremities  Post vital signs: Reviewed and stable  Complications: No apparent anesthesia complications

## 2011-09-17 NOTE — H&P (View-Only) (Signed)
Shaia A. Loor  DOB: 10/15/1940 Single / Language: English / Race: White / Female  Date of Admission:  09/17/2011  Chief complaint:  Right Knee Pain  History of Present Illness The patient is a 71 year old female who comes in today for a preoperative History and Physical. The patient is scheduled for a right total knee arthroplasty to be performed by Dr. Frank V. Aluisio, MD at [facility] on 09/17/2011. She is having more problems with her right knee than her left knee. The right knee is what is really limiting her now. She would like to go ahead and get scheduled for the right knee replacement. The patient has had a lot of longterm intervention with injections, analgesics and attempt at exercise. She has progressed sufficiently with the left knee where we could consider going ahead and scheduling her for the right knee. They have been treated conservatively in the past for the above stated problem and despite conservative measures, they continue to have progressive pain and severe functional limitations and dysfunction. They have failed non-operative management. It is felt that they would benefit from undergoing total joint replacement. Risks and benefits of the procedure have been discussed with the patient and they elect to proceed with surgery. There are no active contraindications to surgery such as ongoing infection or rapidly progressive neurological disease.  Allergies No Known Drug Allergies  Medication History OxyCODONE HCl (5MG Tablet, Oral) Active. Robaxin (500MG Tablet, Oral) Active. Ambien (10MG Tablet, Oral) Active. Effexor XR (150MG Capsule ER 24HR, Oral) Active. KlonoPIN (1MG Tablet, Oral) Active. Neurontin (600MG Tablet, Oral) Active. Prinzide (20-25MG Tablet, Oral daily) Active.  Problem List/Past Medical Hypertension Denerative Disc Disease Childhood Illness: Measels Childhood Illness: Mumps Osteoporosis Depression High blood  pressure Migraine Headache  Past Surgical History Arthroscopy of Knee. bilateral Arthroscopy of Shoulder. bilateral Breast Biopsy. bilateral Hysterectomy. complete (non-cancerous) Neck Disc Surgery Rotator Cuff Repair. bilateral Spinal Surgery Tonsillectomy Total Knee Replacement. left  Family History Cancer. mother and sister Heart Disease. father  Social History Alcohol use. never consumed alcohol Children. 2 Current work status. retired Drug/Alcohol Rehab (Currently). no Drug/Alcohol Rehab (Previously). no Exercise. Exercises rarely; does other Illicit drug use. no Living situation. Lives alone. live alone Marital status. Divorced. divorced Number of flights of stairs before winded. 4-5 Pain Contract. no Tobacco use. Former smoker. never smoker Post-Surgical Plans. Plan is for [facility] in [place]. Pregnant. no  Review of Systems General:Present- Fatigue, Feeling sick and Weight Gain. Not Present- Chills, Fever, Night Sweats, Appetite Loss and Weight Loss. Skin:Present- Itching. Not Present- Rash, Skin Color Changes, Ulcer, Psoriasis and Change in Hair or Nails. Cardiovascular:Present- Leg Cramps. Not Present- Shortness of Breath, Chest Pain, Swelling of Extremities and Palpitations. Gastrointestinal:Present- Heartburn and Nausea. Not Present- Bloody Stool, Abdominal Pain, Vomiting and Incontinence of Stool. Female Genitourinary:Present- Frequency. Not Present- Blood in Urine, Menstrual Irregularities, Incontinence and Nocturia. Musculoskeletal:Present- Joint Pain and Back Pain. Not Present- Muscle Weakness, Muscle Pain, Joint Stiffness and Joint Swelling. Neurological:Present- Tremor, Headaches and Dizziness. Not Present- Tingling, Numbness and Burning. Psychiatric:Present- Anxiety and Memory Loss. Not Present- Depression. Endocrine:Present- Excessive hunger. Not Present- Cold Intolerance, Heat Intolerance and Excessive  Thirst.   Vitals Pulse: 88 (Regular) Resp.: 14 (Unlabored) BP: 122/68 (Sitting, Right Arm, Standard)  Physical Exam) The physical exam findings are as follows: Patient is a 71 year old female with continued knee pain. Patient is accompanied today by her daughter.  General Mental Status - Alert, cooperative and good historian. General Appearance- pleasant. Not   in acute distress. Orientation- Oriented X3. Build & Nutrition- Well nourished and Well developed.  Head and Neck Head- normocephalic, atraumatic . Neck Global Assessment- supple. no bruit auscultated on the right and no bruit auscultated on the left.  Eye Pupil- Bilateral- Round and Unequal. Note: wears glasses Motion- Bilateral- EOMI.  Chest and Lung Exam Auscultation: Breath sounds:- clear at anterior chest wall and - clear at posterior chest wall. Adventitious sounds:- No Adventitious sounds.  Cardiovascular Auscultation:Rhythm- Regular rate and rhythm. Heart Sounds- S1 WNL and S2 WNL. Murmurs & Other Heart Sounds:Auscultation of the heart reveals - No Murmurs.  Abdomen Palpation/Percussion:Tenderness- Abdomen is non-tender to palpation. Rigidity (guarding)- Abdomen is soft. Auscultation:Auscultation of the abdomen reveals - Bowel sounds normal.  Female Genitourinary Not done, not pertinent to present illness  Musculoskeletal On examination, well-developed female, alert and oriented, in no apparent distress. Examination of left knee looks excellent. Her range of motion is about five to 110 degrees. She is not tender and there is no instability. Examination of the right knee, range of motion is about 10 to 120 degrees, marked crepitus on range of motion. Tender medial greater than lateral with no instability noted.  RADIOGRAPHS: We xr-rayed the left knee today and the patient has a prosthesis in excellent position with no periprosthetic abnormalities. Radiographs of the  right knee from the past are reviewed and she is bone-on-bone in the medial and patellofemoral compartments.  Assessment & Plan Osteoarthritis Right Knee  Patient is for a Right Total Knee Replacement by Dr. Aluisio.  Plan is to go the [facility] in [place].  PCP - Dr. Cynthia Butler - Patient has been seen preoperatively by Dr. Butler and felt to be stable for surgery.  Drew Idalie Canto, PA-C   

## 2011-09-17 NOTE — Interval H&P Note (Signed)
History and Physical Interval Note:  09/17/2011 12:20 PM  Maria George  has presented today for surgery, with the diagnosis of Localized osteoarthrosis not specified whether primary or secondary, lower leg [715.36]  The various methods of treatment have been discussed with the patient and family. After consideration of risks, benefits and other options for treatment, the patient has consented to  Procedure(s): TOTAL KNEE ARTHROPLASTY as a surgical intervention .  The patients' history has been reviewed, patient examined, no change in status, stable for surgery.  I have reviewed the patients' chart and labs.  Questions were answered to the patient's satisfaction.     Loanne Drilling

## 2011-09-17 NOTE — Preoperative (Signed)
Beta Blockers   Reason not to administer Beta Blockers:Not Applicable, pt not on home BB 

## 2011-09-17 NOTE — Anesthesia Postprocedure Evaluation (Signed)
  Anesthesia Post-op Note  Patient: Maria George  Procedure(s) Performed:  TOTAL KNEE ARTHROPLASTY  Patient Location: PACU  Anesthesia Type: General  Level of Consciousness: awake and alert   Airway and Oxygen Therapy: Patient Spontanous Breathing  Post-op Pain: mild  Post-op Assessment: Post-op Vital signs reviewed, Patient's Cardiovascular Status Stable, Respiratory Function Stable, Patent Airway and No signs of Nausea or vomiting  Post-op Vital Signs: stable  Complications: No apparent anesthesia complications

## 2011-09-17 NOTE — Op Note (Signed)
Pre-operative diagnosis- Osteoarthritis  Right knee(s)  Post-operative diagnosis- Osteoarthritis Right knee(s)  Procedure-  Right  Total Knee Arthroplasty  Surgeon- Gus Rankin. Curby Carswell, MD  Assistant- Avel Peace, PA-C   Anesthesia-  General EBL-* No blood loss amount entered *  Drains Hemovac  Tourniquet time- * Missing tourniquet times found for documented tourniquets in log:  6891 *   Complications- None  Condition-PACU - hemodynamically stable.   Brief Clinical Note  Maria George is a 71 y.o. year old female with end stage OA of her right knee with progressively worsening pain and dysfunction. She has constant pain, with activity and at rest and significant functional deficits with difficulties even with ADLs. She has had extensive non-op management including analgesics, injections of cortisone and viscosupplements, and home exercise program, but remains in significant pain with significant dysfunction.Radiographs show bone on bone arthritis lateral and patellofemoral with subchondral sclerosis laterally. She presents now for left Total Knee Arthroplasty.    Procedure in detail---   The patient is brought into the operating room and positioned supine on the operating table. After successful administration of  General,   a tourniquet is placed high on the  Right thigh(s) and the lower extremity is prepped and draped in the usual sterile fashion. Time out is performed by the operating team and then the  Right lower extremity is wrapped in Esmarch, knee flexed and the tourniquet inflated to 300 mmHg.       A midline incision is made with a ten blade through the subcutaneous tissue to the level of the extensor mechanism. A fresh blade is used to make a medial parapatellar arthrotomy. Soft tissue over the proximal medial tibia is subperiosteally elevated to the joint line with a knife and into the semimembranosus bursa with a Cobb elevator. Soft tissue over the proximal lateral tibia is  elevated with attention being paid to avoiding the patellar tendon on the tibial tubercle. The patella is everted, knee flexed 90 degrees and the ACL and PCL are removed. Findings are bone on bone lateral without valgus deformity but with marginal osteophytes.        The drill is used to create a starting hole in the distal femur and the canal is thoroughly irrigated with sterile saline to remove the fatty contents. The 5 degree Right  valgus alignment guide is placed into the femoral canal and the distal femoral cutting block is pinned to remove 11 mm off the distal femur. Resection is made with an oscillating saw.      The tibia is subluxed forward and the menisci are removed. The extramedullary alignment guide is placed referencing proximally at the medial aspect of the tibial tubercle and distally along the second metatarsal axis and tibial crest. The block is pinned to remove 2mm off the more deficient lateral  side. Resection is made with an oscillating saw. Size 2.5is the most appropriate size for the tibia and the proximal tibia is prepared with the modular drill and keel punch for that size.      The femoral sizing guide is placed and size 3 is most appropriate. Rotation is marked off the epicondylar axis and confirmed by creating a rectangular flexion gap at 90 degrees. The size 3 cutting block is pinned in this rotation and the anterior, posterior and chamfer cuts are made with the oscillating saw. The intercondylar block is then placed and that cut is made.      Trial size 3 tibial component, trial size 3  posterior stabilized femur and a 12.5  mm posterior stabilized rotating platform insert trial is placed. Full extension is achieved with excellent varus/valgus and anterior/posterior balance throughout full range of motion. The patella is everted and thickness measured to be 24  mm. Free hand resection is taken to 14 mm, a 35 template is placed, lug holes are drilled, trial patella is placed, and it  tracks normally. Osteophytes are removed off the posterior femur with the trial in place. All trials are removed and the cut bone surfaces prepared with pulsatile lavage. Cement is mixed and once ready for implantation, the size 2.5 tibial implant, size  3 posterior stabilized femoral component, and the size 35 patella are cemented in place and the patella is held with the clamp. The trial insert is placed and the knee held in full extension. All extruded cement is removed and once the cement is hard the permanent 12.5 mm posterior stabilized rotating platform insert is placed into the tibial tray.      The wound is copiously irrigated with saline solution and the extensor mechanism closed over a hemovac drain with #1 PDS suture. The tourniquet is released for a total tourniquet time of 33  minutes. Flexion against gravity is 140 degrees and the patella tracks normally. Subcutaneous tissue is closed with 2.0 vicryl and subcuticular with running 4.0 Monocryl. The catheter for the Marcaine pain pump is placed and the pump is initiated. The incision is cleaned and dried and steri-strips and a bulky sterile dressing are applied. The limb is placed into a knee immobilizer and the patient is awakened and transported to recovery in stable condition.      Please note that a surgical assistant was a medical necessity for this procedure in order to perform it in a safe and expeditious manner. Surgical assistant was necessary to retract the ligaments and vital neurovascular structures to prevent injury to them and also necessary for proper positioning of the limb to allow for anatomic placement of the prosthesis.   Gus Rankin Mohit Zirbes, MD    09/17/2011, 1:36 PM

## 2011-09-17 NOTE — Anesthesia Preprocedure Evaluation (Addendum)
Anesthesia Evaluation  Patient identified by MRN, date of birth, ID band Patient awake    Reviewed: Allergy & Precautions, H&P , NPO status , Patient's Chart, lab work & pertinent test results  Airway Mallampati: II TM Distance: >3 FB Neck ROM: Full    Dental No notable dental hx.    Pulmonary neg pulmonary ROS,  clear to auscultation  Pulmonary exam normal       Cardiovascular hypertension, Pt. on medications Regular Normal    Neuro/Psych  Headaches, PSYCHIATRIC DISORDERS Depression Headache today    GI/Hepatic negative GI ROS, Neg liver ROS,   Endo/Other  Negative Endocrine ROS  Renal/GU negative Renal ROS  Genitourinary negative   Musculoskeletal negative musculoskeletal ROS (+)   Abdominal   Peds negative pediatric ROS (+)  Hematology negative hematology ROS (+)   Anesthesia Other Findings   Reproductive/Obstetrics negative OB ROS                           Anesthesia Physical Anesthesia Plan  ASA: II  Anesthesia Plan: General   Post-op Pain Management:    Induction: Intravenous  Airway Management Planned: Oral ETT  Additional Equipment:   Intra-op Plan:   Post-operative Plan: Extubation in OR  Informed Consent: I have reviewed the patients History and Physical, chart, labs and discussed the procedure including the risks, benefits and alternatives for the proposed anesthesia with the patient or authorized representative who has indicated his/her understanding and acceptance.   Dental advisory given  Plan Discussed with: CRNA  Anesthesia Plan Comments: (Prefers general. Had general for other TKA. Mac 4 times 1 with bougie.)       Anesthesia Quick Evaluation

## 2011-09-17 NOTE — H&P (Signed)
Maria George  DOB: April 22, 1941 Single / Language: English / Race: White / Female  Date of Admission:  09/17/2011  Chief complaint:  Right Knee Pain  History of Present Illness The patient is a 71 year old female who comes in today for a preoperative History and Physical. The patient is scheduled for a right total knee arthroplasty to be performed by Dr. Gus Rankin. Aluisio, MD at [facility] on 09/17/2011. She is having more problems with her right knee than her left knee. The right knee is what is really limiting her now. She would like to go ahead and get scheduled for the right knee replacement. The patient has had a lot of longterm intervention with injections, analgesics and attempt at exercise. She has progressed sufficiently with the left knee where we could consider going ahead and scheduling her for the right knee. They have been treated conservatively in the past for the above stated problem and despite conservative measures, they continue to have progressive pain and severe functional limitations and dysfunction. They have failed non-operative management. It is felt that they would benefit from undergoing total joint replacement. Risks and benefits of the procedure have been discussed with the patient and they elect to proceed with surgery. There are no active contraindications to surgery such as ongoing infection or rapidly progressive neurological disease.  Allergies No Known Drug Allergies  Medication History OxyCODONE HCl (5MG  Tablet, Oral) Active. Robaxin (500MG  Tablet, Oral) Active. Ambien (10MG  Tablet, Oral) Active. Effexor XR (150MG  Capsule ER 24HR, Oral) Active. KlonoPIN (1MG  Tablet, Oral) Active. Neurontin (600MG  Tablet, Oral) Active. Prinzide (20-25MG  Tablet, Oral daily) Active.  Problem List/Past Medical Hypertension Denerative Disc Disease Childhood Illness: Measels Childhood Illness: Mumps Osteoporosis Depression High blood  pressure Migraine Headache  Past Surgical History Arthroscopy of Knee. bilateral Arthroscopy of Shoulder. bilateral Breast Biopsy. bilateral Hysterectomy. complete (non-cancerous) Neck Disc Surgery Rotator Cuff Repair. bilateral Spinal Surgery Tonsillectomy Total Knee Replacement. left  Family History Cancer. mother and sister Heart Disease. father  Social History Alcohol use. never consumed alcohol Children. 2 Current work status. retired Financial planner (Currently). no Drug/Alcohol Rehab (Previously). no Exercise. Exercises rarely; does other Illicit drug use. no Living situation. Lives alone. live alone Marital status. Divorced. divorced Number of flights of stairs before winded. 4-5 Pain Contract. no Tobacco use. Former smoker. never smoker Post-Surgical Plans. Plan is for [facility] in [place]. Pregnant. no  Review of Systems General:Present- Fatigue, Feeling sick and Weight Gain. Not Present- Chills, Fever, Night Sweats, Appetite Loss and Weight Loss. Skin:Present- Itching. Not Present- Rash, Skin Color Changes, Ulcer, Psoriasis and Change in Hair or Nails. Cardiovascular:Present- Leg Cramps. Not Present- Shortness of Breath, Chest Pain, Swelling of Extremities and Palpitations. Gastrointestinal:Present- Heartburn and Nausea. Not Present- Bloody Stool, Abdominal Pain, Vomiting and Incontinence of Stool. Female Genitourinary:Present- Frequency. Not Present- Blood in Urine, Menstrual Irregularities, Incontinence and Nocturia. Musculoskeletal:Present- Joint Pain and Back Pain. Not Present- Muscle Weakness, Muscle Pain, Joint Stiffness and Joint Swelling. Neurological:Present- Tremor, Headaches and Dizziness. Not Present- Tingling, Numbness and Burning. Psychiatric:Present- Anxiety and Memory Loss. Not Present- Depression. Endocrine:Present- Excessive hunger. Not Present- Cold Intolerance, Heat Intolerance and Excessive  Thirst.   Vitals Pulse: 88 (Regular) Resp.: 14 (Unlabored) BP: 122/68 (Sitting, Right Arm, Standard)  Physical Exam) The physical exam findings are as follows: Patient is a 71 year old female with continued knee pain. Patient is accompanied today by her daughter.  General Mental Status - Alert, cooperative and good historian. General Appearance- pleasant. Not  in acute distress. Orientation- Oriented X3. Build & Nutrition- Well nourished and Well developed.  Head and Neck Head- normocephalic, atraumatic . Neck Global Assessment- supple. no bruit auscultated on the right and no bruit auscultated on the left.  Eye Pupil- Bilateral- Round and Unequal. Note: wears glasses Motion- Bilateral- EOMI.  Chest and Lung Exam Auscultation: Breath sounds:- clear at anterior chest wall and - clear at posterior chest wall. Adventitious sounds:- No Adventitious sounds.  Cardiovascular Auscultation:Rhythm- Regular rate and rhythm. Heart Sounds- S1 WNL and S2 WNL. Murmurs & Other Heart Sounds:Auscultation of the heart reveals - No Murmurs.  Abdomen Palpation/Percussion:Tenderness- Abdomen is non-tender to palpation. Rigidity (guarding)- Abdomen is soft. Auscultation:Auscultation of the abdomen reveals - Bowel sounds normal.  Female Genitourinary Not done, not pertinent to present illness  Musculoskeletal On examination, well-developed female, alert and oriented, in no apparent distress. Examination of left knee looks excellent. Her range of motion is about five to 110 degrees. She is not tender and there is no instability. Examination of the right knee, range of motion is about 10 to 120 degrees, marked crepitus on range of motion. Tender medial greater than lateral with no instability noted.  RADIOGRAPHS: We xr-rayed the left knee today and the patient has a prosthesis in excellent position with no periprosthetic abnormalities. Radiographs of the  right knee from the past are reviewed and she is bone-on-bone in the medial and patellofemoral compartments.  Assessment & Plan Osteoarthritis Right Knee  Patient is for a Right Total Knee Replacement by Dr. Lequita Halt.  Plan is to go the [facility] in [place].  PCP - Dr. Samuel Jester - Patient has been seen preoperatively by Dr. Charm Barges and felt to be stable for surgery.  Maria Peace, PA-C

## 2011-09-17 NOTE — Anesthesia Procedure Notes (Signed)
Procedure Name: Intubation Date/Time: 09/17/2011 12:34 PM Performed by: Randon Goldsmith CATHERINE PAYNE Pre-anesthesia Checklist: Patient identified, Emergency Drugs available, Suction available and Patient being monitored Patient Re-evaluated:Patient Re-evaluated prior to inductionOxygen Delivery Method: Circle System Utilized Preoxygenation: Pre-oxygenation with 100% oxygen Intubation Type: IV induction Ventilation: Mask ventilation without difficulty Laryngoscope Size: Mac and 3 Grade View: Grade I Tube type: Oral Number of attempts: 1 Airway Equipment and Method: stylet Secured at: 21 cm Tube secured with: Tape Dental Injury: Teeth and Oropharynx as per pre-operative assessment  Comments: Intubation by S. [place] Scientist, clinical (histocompatibility and immunogenetics)

## 2011-09-18 ENCOUNTER — Encounter (HOSPITAL_COMMUNITY): Payer: Self-pay | Admitting: Orthopedic Surgery

## 2011-09-18 LAB — CBC
HCT: 31.9 % — ABNORMAL LOW (ref 36.0–46.0)
MCH: 30.3 pg (ref 26.0–34.0)
MCHC: 32.9 g/dL (ref 30.0–36.0)
MCV: 92.2 fL (ref 78.0–100.0)
Platelets: 293 10*3/uL (ref 150–400)
RDW: 13.5 % (ref 11.5–15.5)
WBC: 10.2 10*3/uL (ref 4.0–10.5)

## 2011-09-18 LAB — BASIC METABOLIC PANEL
BUN: 17 mg/dL (ref 6–23)
Calcium: 8.7 mg/dL (ref 8.4–10.5)
Creatinine, Ser: 1 mg/dL (ref 0.50–1.10)
GFR calc Af Amer: 65 mL/min — ABNORMAL LOW (ref >90)
GFR calc non Af Amer: 56 mL/min — ABNORMAL LOW (ref >90)

## 2011-09-18 MED ORDER — MORPHINE SULFATE 2 MG/ML IJ SOLN
1.0000 mg | INTRAMUSCULAR | Status: DC | PRN
  Administered 2011-09-19: 2 mg via INTRAVENOUS
  Filled 2011-09-18 (×2): qty 1

## 2011-09-18 MED ORDER — PANTOPRAZOLE SODIUM 20 MG PO TBEC
20.0000 mg | DELAYED_RELEASE_TABLET | Freq: Two times a day (BID) | ORAL | Status: DC
  Administered 2011-09-18 – 2011-09-20 (×5): 20 mg via ORAL
  Filled 2011-09-18 (×6): qty 1

## 2011-09-18 NOTE — Evaluation (Signed)
Physical Therapy Evaluation Patient Details Name: Maria George MRN: [id number] DOB: 1941/06/19 Today's Date: 09/18/2011  Problem List:  Patient Active Problem List  Diagnoses  . OA (osteoarthritis) of knee    Past Medical History:  Past Medical History  Diagnosis Date  . Hypertension   . Osteoporosis   . Migraine   . Depression    Past Surgical History:  Past Surgical History  Procedure Date  . Knee arthroscopy jan 2012    bilateral knees  . Arthrscopy bilateral shoulder yrs ago    right x 1, left x 1  . Breast biopsy bilateral yrs ago  . Abdominal hysterectomy 1974  . Cervical laminectomy 1980's  . Lower back surgery 1990's  . Bilateral rotator cuff repair 2003  . Tonsillectomy 1970's  . Total knee arthroplasty sept 2012  . Rectocle and cystocele repair late 1980's  . Joint replacement 04/2012    Left Knee  . Total knee arthroplasty 09/17/2011    Procedure: TOTAL KNEE ARTHROPLASTY;  Surgeon: Loanne Drilling, MD;  Location: [facility];  Service: Orthopedics;  Laterality: Right;    PT Assessment/Plan/Recommendation PT Assessment Clinical Impression Statement: pt is s/p RTKA , amb. first time OOB, will benefit from PT to improve mobility, rom, strength to DC to snf PT Recommendation/Assessment: Patient will need skilled PT in the acute care venue PT Problem List: Decreased strength;Decreased range of motion;Decreased activity tolerance;Decreased knowledge of use of DME;Pain PT Therapy Diagnosis : Difficulty walking;Acute pain PT Plan PT Frequency: 7X/week PT Recommendation Follow Up Recommendations: Skilled nursing facility Equipment Recommended: Defer to next venue PT Goals  Acute Rehab PT Goals PT Goal Formulation: With patient/family Time For Goal Achievement: 7 days Pt will go Supine/Side to Sit: with supervision PT Goal: Supine/Side to Sit - Progress: Goal set today Pt will go Sit to Supine/Side: with supervision PT Goal: Sit to Supine/Side - Progress: Goal  set today Pt will go Sit to Stand: with supervision PT Goal: Sit to Stand - Progress: Goal set today Pt will go Stand to Sit: with supervision PT Goal: Stand to Sit - Progress: Goal set today Pt will Ambulate: 51 - 150 feet;with supervision;with rolling walker PT Goal: Ambulate - Progress: Goal set today Pt will Perform Home Exercise Program: with supervision, verbal cues required/provided PT Goal: Perform Home Exercise Program - Progress: Goal set today  PT Evaluation Precautions/Restrictions  Precautions Precautions: Knee Required Braces or Orthoses: Yes Prior Functioning  Home Living Lives With: Alone Receives Help From: Family Additional Comments: plans snf Prior Function Level of Independence: Independent with basic ADLs Able to Take Stairs?: Yes Cognition Cognition Arousal/Alertness: Awake/alert Overall Cognitive Status: Appears within functional limits for tasks assessed Orientation Level: Oriented X4 Sensation/Coordination Sensation Light Touch: Appears Intact Extremity Assessment RLE Assessment RLE Assessment: Exceptions to [facility] RLE AROM (degrees) RLE Overall AROM Comments: NT RLE Strength RLE Overall Strength Comments: requires A to move RLE LLE Assessment LLE Assessment: Within Functional Limits Mobility (including Balance) Bed Mobility Bed Mobility: Yes Supine to Sit: 3: Mod assist;With rails Supine to Sit Details (indicate cue type and reason): vc to push w/UEs to EOB Transfers Transfers: Yes Sit to Stand: 1: +2 Total assist;With upper extremity assist;From bed;From elevated surface Sit to Stand Details (indicate cue type and reason): vc to place LLE under to stand, bed raised to assist Stand to Sit: 4: Min assist;With armrests;To chair/3-in-1 Stand to Sit Details: vc to place RLE forward, reach for armrests Ambulation/Gait Ambulation/Gait: Yes Ambulation/Gait Assistance: 1: +2 Total  assist Ambulation/Gait Assistance Details (indicate cue type and  reason): vc for sequence posture Ambulation Distance (Feet): 30 Feet Assistive device: Rolling walker Gait Pattern: Step-to pattern    Exercise    End of Session PT - End of Session Activity Tolerance: Patient tolerated treatment well (pt takes extra time for activity) Nurse Communication: Mobility status for transfers General Behavior During Session: [facility] for tasks performed Cognition:  (sluggish)  Rada Hay 09/18/2011, 2:59 PM

## 2011-09-18 NOTE — Progress Notes (Signed)
Subjective: 1 Day Post-Op Procedure(s) (LRB): TOTAL KNEE ARTHROPLASTY (Right) Patient reports pain as mild.   Patient seen in rounds by Dr. Lequita Halt. Patient has complaints of some soreness but doing OK We will start therapy today. Plan is to go [facility] in [place] after hospital stay.  Objective: Vital signs in last 24 hours: Temp:  [97.4 F (36.3 C)-98.3 F (36.8 C)] 97.9 F (36.6 C) (01/31 0600) Pulse Rate:  [78-109] 83  (01/31 0600) Resp:  [9-20] 18  (01/31 0600) BP: (93-153)/(60-88) 106/67 mmHg (01/31 0600) SpO2:  [92 %-100 %] 98 % (01/31 0600) Weight:  [85.548 kg (188 lb 9.6 oz)] 85.548 kg (188 lb 9.6 oz) (01/30 1540)  Intake/Output from previous day:  Intake/Output Summary (Last 24 hours) at 09/18/11 0734 Last data filed at 09/18/11 1610  Gross per 24 hour  Intake 4288.75 ml  Output   1460 ml  Net 2828.75 ml    Intake/Output this shift: UOP 550  Labs:  Basename 09/18/11 0414 09/15/11 1415  HGB 10.5* 13.0    Basename 09/18/11 0414 09/15/11 1415  WBC 10.2 8.0  RBC 3.46* 4.27  HCT 31.9* 39.0  PLT 293 324    Basename 09/18/11 0414 09/15/11 1415  NA 135 138  K 5.1 3.9  CL 101 102  CO2 24 28  BUN 17 17  CREATININE 1.00 0.98  GLUCOSE 160* 87  CALCIUM 8.7 9.7    Basename 09/15/11 1415  LABPT --  INR 0.92    Exam - Neurovascular intact Sensation intact distally Dressing - clean, dry Motor function intact - moving foot and toes well on exam.  Hemovac pulled without difficulty.  Past Medical History  Diagnosis Date  . Hypertension   . Osteoporosis   . Migraine   . Depression     Assessment/Plan: 1 Day Post-Op Procedure(s) (LRB): TOTAL KNEE ARTHROPLASTY (Right) Principal Problem:  *OA (osteoarthritis) of knee   Advance diet Up with therapy Discharge to SNF - [facility]  DVT Prophylaxis - Xarelto  Protocol Weight-Bearing as tolerated to right leg Keep foley until tomorrow. No vaccines. D/C PCA Morphine, Change to IV  push D/C O2 and Pulse OX and try on Room [address]  Patrica Duel 09/18/2011, 7:34 AM

## 2011-09-18 NOTE — Progress Notes (Signed)
FL2 in shadow chart for MD signature. SNF search initiated . Pt is interested in [facility]. SNF expects to have a bed for pt when ready for d/c. CSW will follow to assist with d/c planning to SNF.

## 2011-09-18 NOTE — Progress Notes (Signed)
Physical Therapy Treatment Patient Details Name: Maria George MRN: [id number] DOB: [birth year] Today's Date: 09/18/2011  PT Assessment/Plan  PT - Assessment/Plan Comments on Treatment Session: pt tolerating very well with <2/10 pain. pt flexes Knee to 40 degr. PT Plan: Discharge plan remains appropriate;Frequency remains appropriate PT Frequency: 7X/week Follow Up Recommendations: Skilled nursing facility Equipment Recommended: Defer to next venue PT Goals  Acute Rehab PT Goals PT Goal Formulation: With patient/family Time For Goal Achievement: 7 days Pt will go Supine/Side to Sit: with supervision PT Goal: Supine/Side to Sit - Progress: Goal set today Pt will go Sit to Supine/Side: with supervision PT Goal: Sit to Supine/Side - Progress: Progressing toward goal Pt will go Sit to Stand: with supervision PT Goal: Sit to Stand - Progress: Progressing toward goal Pt will go Stand to Sit: with supervision PT Goal: Stand to Sit - Progress: Partly met Pt will Ambulate: 51 - 150 feet;with supervision;with rolling walker PT Goal: Ambulate - Progress: Progressing toward goal Pt will Perform Home Exercise Program: with supervision, verbal cues required/provided PT Goal: Perform Home Exercise Program - Progress: Progressing toward goal  PT Treatment Precautions/Restrictions  Precautions Precautions: Knee Required Braces or Orthoses: Yes Restrictions Weight Bearing Restrictions: No Mobility (including Balance) Bed Mobility Bed Mobility: Yes Supine to Sit: 3: Mod assist;With rails Supine to Sit Details (indicate cue type and reason): vc to push w/UEs to EOB Sit to Supine: 4: Min assist Sit to Supine - Details (indicate cue type and reason): A for rle ONTO BED Transfers Transfers: Yes Sit to Stand: 4: Min assist;With armrests;From chair/3-in-1 Sit to Stand Details (indicate cue type and reason): vc to push from recliner Stand to Sit: 4: Min assist;To bed;With upper extremity  assist Stand to Sit Details: vc to place RLE forward Ambulation/Gait Ambulation/Gait: Yes Ambulation/Gait Assistance: 4: Min assist Ambulation/Gait Assistance Details (indicate cue type and reason): vc for sequence posture Ambulation Distance (Feet): 5 Feet Assistive device: Rolling walker Gait Pattern: Step-to pattern    Exercise  Total Joint Exercises Quad Sets: AROM;Right;10 reps Heel Slides: AAROM;Right;10 reps Hip ABduction/ADduction: AROM;Right;10 reps Straight Leg Raises: AROM;Right;10 reps End of Session PT - End of Session Equipment Utilized During Treatment: Right knee immobilizer Activity Tolerance: Patient tolerated treatment well Patient left: in bed;with call bell in reach;with family/visitor present Nurse Communication: Mobility status for transfers General Behavior During Session: [facility] for tasks performed Cognition:  (sluggish)  Rada Hay 09/18/2011, 4:01 PM

## 2011-09-19 LAB — CBC
HCT: 29.3 % — ABNORMAL LOW (ref 36.0–46.0)
MCHC: 32.8 g/dL (ref 30.0–36.0)
Platelets: 260 10*3/uL (ref 150–400)
RDW: 13.8 % (ref 11.5–15.5)
WBC: 11.9 10*3/uL — ABNORMAL HIGH (ref 4.0–10.5)

## 2011-09-19 LAB — BASIC METABOLIC PANEL
BUN: 18 mg/dL (ref 6–23)
Chloride: 105 mEq/L (ref 96–112)
GFR calc Af Amer: 61 mL/min — ABNORMAL LOW (ref >90)
GFR calc non Af Amer: 53 mL/min — ABNORMAL LOW (ref >90)
Potassium: 4 mEq/L (ref 3.5–5.1)
Sodium: 139 mEq/L (ref 135–145)

## 2011-09-19 MED ORDER — DSS 100 MG PO CAPS: 100.0000 mg | ORAL_CAPSULE | Freq: Two times a day (BID) | ORAL | Status: AC

## 2011-09-19 MED ORDER — ONDANSETRON HCL 4 MG PO TABS: 4.0000 mg | ORAL_TABLET | Freq: Four times a day (QID) | ORAL | Status: AC | PRN

## 2011-09-19 MED ORDER — POLYSACCHARIDE IRON 150 MG PO CAPS
150.0000 mg | ORAL_CAPSULE | Freq: Every day | ORAL | Status: DC
  Administered 2011-09-19 – 2011-09-20 (×2): 150 mg via ORAL
  Filled 2011-09-19 (×2): qty 1

## 2011-09-19 MED ORDER — OXYCODONE HCL 5 MG PO TABS: 5.0000 mg | ORAL_TABLET | ORAL | Status: AC | PRN

## 2011-09-19 MED ORDER — POLYSACCHARIDE IRON 150 MG PO CAPS: 150.0000 mg | ORAL_CAPSULE | Freq: Every day | ORAL | Status: DC

## 2011-09-19 MED ORDER — RIVAROXABAN 10 MG PO TABS: 10.0000 mg | ORAL_TABLET | Freq: Every day | ORAL | Status: DC

## 2011-09-19 MED ORDER — ACETAMINOPHEN 325 MG PO TABS: 650.0000 mg | ORAL_TABLET | Freq: Four times a day (QID) | ORAL | Status: DC | PRN

## 2011-09-19 MED ORDER — METHOCARBAMOL 500 MG PO TABS: 500.0000 mg | ORAL_TABLET | Freq: Four times a day (QID) | ORAL | Status: AC | PRN

## 2011-09-19 NOTE — Progress Notes (Signed)
Physical Therapy Treatment Patient Details Name: Maria George MRN: [id number] DOB: 1941-07-04 Today's Date: 09/19/2011  R TKR POD #2 pm session 13:50 - 14;10 1 ta  PT Assessment/Plan  PT - Assessment/Plan Comments on Treatment Session: Assisted pt back to bed for CPM PT Plan: Discharge plan remains appropriate Follow Up Recommendations: Skilled nursing facility Equipment Recommended: Defer to next venue PT Goals  Acute Rehab PT Goals PT Goal Formulation: With patient Pt will go Supine/Side to Sit: with supervision PT Goal: Supine/Side to Sit - Progress: Progressing toward goal Pt will go Sit to Supine/Side: with supervision PT Goal: Sit to Supine/Side - Progress: Progressing toward goal Pt will go Sit to Stand: with supervision PT Goal: Sit to Stand - Progress: Progressing toward goal Pt will go Stand to Sit: with supervision PT Goal: Stand to Sit - Progress: Progressing toward goal Pt will Ambulate: 51 - 150 feet;with supervision;with rolling walker PT Goal: Ambulate - Progress: Progressing toward goal Pt will Perform Home Exercise Program: with supervision, verbal cues required/provided PT Goal: Perform Home Exercise Program - Progress: Progressing toward goal  PT Treatment Precautions/Restrictions  Precautions Precautions: Knee Precaution Comments: Pt instructed on KI use and when to D/C Required Braces or Orthoses: Yes Knee Immobilizer: Discontinue once straight leg raise with < 10 degree lag Restrictions Weight Bearing Restrictions: No Other Position/Activity Restrictions: WBAT Mobility (including Balance)  Assisted pt back to bed + 2 total assist pt 50% with max c/o fatigue required increased time and 75% VC's to extend R LE and reach back for the bed prior to sit.   End of Session PT - End of Session Equipment Utilized During Treatment: Gait belt Activity Tolerance: Patient tolerated treatment well Patient left: in bed;with call bell in reach;with  family/visitor present General Behavior During Session: [facility] for tasks performed Cognition: [facility] for tasks performed  Felecia Shelling  PTA [facility]  Acute  Rehab Pager     [phone]

## 2011-09-19 NOTE — Progress Notes (Signed)
Subjective: 2 Days Post-Op Procedure(s) (LRB): TOTAL KNEE ARTHROPLASTY (Right) Patient reports pain as moderate.   Patient has complaints of moderate pain today.  She did a lot with therapy and had more pain thru the night and this morning.  Family in room.  She is looking into [facility] and is also interested in [facility].  Will se how she does and wait to hear form Asher Muir.  Objective: Vital signs in last 24 hours: Temp:  [97.5 F (36.4 C)-98.5 F (36.9 C)] 98.5 F (36.9 C) (02/01 0445) Pulse Rate:  [93-103] 103  (02/01 0445) Resp:  [18] 18  (02/01 0445) BP: (104-142)/(68-85) 142/85 mmHg (02/01 0445) SpO2:  [93 %-99 %] 95 % (02/01 0445)  Intake/Output from previous day:  Intake/Output Summary (Last 24 hours) at 09/19/11 0747 Last data filed at 09/19/11 0445  Gross per 24 hour  Intake 1203.67 ml  Output    900 ml  Net 303.67 ml    Intake/Output this shift:    Labs:  Basename 09/19/11 0422 09/18/11 0414  HGB 9.6* 10.5*    Basename 09/19/11 0422 09/18/11 0414  WBC 11.9* 10.2  RBC 3.16* 3.46*  HCT 29.3* 31.9*  PLT 260 293    Basename 09/19/11 0422 09/18/11 0414  NA 139 135  K 4.0 5.1  CL 105 101  CO2 27 24  BUN 18 17  CREATININE 1.05 1.00  GLUCOSE 105* 160*  CALCIUM 8.4 8.7   No results found for this basename: LABPT:2,INR:2 in the last 72 hours  Exam - Neurovascular intact Sensation intact distally Dressing/Incision - clean, dry, no drainage Motor function intact - moving foot and toes well on exam.   Past Medical History  Diagnosis Date  . Hypertension   . Osteoporosis   . Migraine   . Depression     Assessment/Plan: 2 Days Post-Op Procedure(s) (LRB): TOTAL KNEE ARTHROPLASTY (Right) Principal Problem:  *OA (osteoarthritis) of knee   Advance diet Up with therapy Discharge to SNF  DVT Prophylaxis - Xarelto Protocol Weight-Bearing as tolerated to right leg DC foley  PERKINS, ALEXZANDREW 09/19/2011, 7:47 AM

## 2011-09-19 NOTE — Progress Notes (Signed)
Physical Therapy Treatment Patient Details Name: Maria George MRN: [id number] DOB: 1941-02-08 Today's Date: 09/19/2011  R TKR POD #2 am session 9:45 - 10:25 1 gt  2 ta  PT Assessment/Plan  PT - Assessment/Plan Comments on Treatment Session: Pt c/o poor sleep last night 2nd pain.  Pt plans to D/c to SNF. PT Plan: Discharge plan remains appropriate Follow Up Recommendations: Skilled nursing facility Equipment Recommended: Defer to next venue PT Goals  Acute Rehab PT Goals PT Goal Formulation: With patient Pt will go Supine/Side to Sit: with supervision PT Goal: Supine/Side to Sit - Progress: Progressing toward goal Pt will go Sit to Supine/Side: with supervision PT Goal: Sit to Supine/Side - Progress: Progressing toward goal Pt will go Sit to Stand: with supervision PT Goal: Sit to Stand - Progress: Progressing toward goal Pt will go Stand to Sit: with supervision PT Goal: Stand to Sit - Progress: Progressing toward goal Pt will Ambulate: 51 - 150 feet;with supervision;with rolling walker PT Goal: Ambulate - Progress: Progressing toward goal Pt will Perform Home Exercise Program: with supervision, verbal cues required/provided PT Goal: Perform Home Exercise Program - Progress: Progressing toward goal  PT Treatment Precautions/Restrictions  Precautions Precautions: Knee Precaution Comments: Pt instructed on KI use and when to D/C Required Braces or Orthoses: Yes Knee Immobilizer: Discontinue once straight leg raise with < 10 degree lag Restrictions Weight Bearing Restrictions: No Other Position/Activity Restrictions: WBAT Mobility (including Balance) Bed Mobility Bed Mobility: Yes Supine to Sit: 3: Mod assist Supine to Sit Details (indicate cue type and reason): HOB elevated 45' and increased time to scoot to EOB Transfers Transfers: Yes Sit to Stand: 2: Max assist;From bed;From elevated surface Sit to Stand Details (indicate cue type and reason): 50% VC's on proper  tech, hand placement and increased time Stand to Sit: 3: Mod assist;To toilet;To chair/3-in-1 Stand to Sit Details: 50% VC's on R LE advancement and hand placement to reach back prior to sit.  Pt demon increased anxiety/fear of falling and required max encouragement Ambulation/Gait Ambulation/Gait: Yes Ambulation/Gait Assistance: 3: Mod assist Ambulation/Gait Assistance Details (indicate cue type and reason): Limited distance 2nd mild c/o feeling "woozy" from "the meds", chair closely following behind.  50% VC's on sequencing and proper walker to self distance Ambulation Distance (Feet): 5 Feet Assistive device: Rolling walker Gait Pattern: Step-to pattern;Decreased stance time - right;Trunk flexed Gait velocity: difficulty weight shifting  Stairs: No Wheelchair Mobility Wheelchair Mobility: No   Assisted pt on/off BSC at Max Assist from the bed but then required +2 total assist off [facility] Exercise  Total Joint Exercises Ankle Circles/Pumps: AROM;Both;10 reps Quad Sets: AROM;Both;10 reps Gluteal Sets: AROM;Both;10 reps Towel Squeeze: AROM;Both;10 reps Short Arc QuadBarbaraann Boys;Right;5 reps Heel Slides: AAROM;Right;5 reps Hip ABduction/ADduction: AAROM;Right;10 reps Straight Leg Raises: AAROM;Right;10 reps End of Session PT - End of Session Equipment Utilized During Treatment: Gait belt;Right knee immobilizer Activity Tolerance: Patient tolerated treatment well Patient left: in chair;with call bell in reach;with family/visitor present General Behavior During Session: [facility] for tasks performed Cognition: [facility] for tasks performed  Felecia Shelling  PTA [facility]  Acute  Rehab Pager     [phone]

## 2011-09-19 NOTE — Progress Notes (Signed)
Occupational Therapy Note Spoke with patient and family. Pt had the other knee done in Sept of 2012 and they both agree that they do not feel she needs OT at this time. She is familiar with these tasks from other surgery. See plan is for SNF. Will sign off. Judithann Sauger OTR/L [phone] 09/19/2011

## 2011-09-19 NOTE — Progress Notes (Signed)
Physical Therapy Treatment Patient Details Name: Maria George MRN: [id number] DOB: [birth year] Today's Date: 09/19/2011  R TKR POD #2 13;10 - 13:35 1 ta  1 gt  PT Assessment/Plan  PT - Assessment/Plan Comments on Treatment Session: assisted pt to [facility] +2 assist, pt unable to void. PT Plan: Discharge plan remains appropriate Follow Up Recommendations: Skilled nursing facility Equipment Recommended: Defer to next venue PT Goals  Acute Rehab PT Goals PT Goal Formulation: With patient Pt will go Supine/Side to Sit: with supervision PT Goal: Supine/Side to Sit - Progress: Progressing toward goal Pt will go Sit to Supine/Side: with supervision PT Goal: Sit to Supine/Side - Progress: Progressing toward goal Pt will go Sit to Stand: with supervision PT Goal: Sit to Stand - Progress: Progressing toward goal Pt will go Stand to Sit: with supervision PT Goal: Stand to Sit - Progress: Progressing toward goal Pt will Ambulate: 51 - 150 feet;with supervision;with rolling walker PT Goal: Ambulate - Progress: Progressing toward goal Pt will Perform Home Exercise Program: with supervision, verbal cues required/provided PT Goal: Perform Home Exercise Program - Progress: Progressing toward goal  PT Treatment Precautions/Restrictions  Precautions Precautions: Knee Precaution Comments: Pt instructed on KI use and when to D/C Required Braces or Orthoses: Yes Knee Immobilizer: Discontinue once straight leg raise with < 10 degree lag Restrictions Weight Bearing Restrictions: No Other Position/Activity Restrictions: WBAT Mobility (including Balance) Bed Mobility Bed Mobility: No Supine to Sit: 3: Mod assist Supine to Sit Details (indicate cue type and reason): HOB elevated 45' and increased time to scoot to EOB Transfers Transfers: Yes Sit to Stand: From chair/3-in-1;1: +2 Total assist Sit to Stand Details (indicate cue type and reason): Total assist + 2 pt 65% with max encouragement and  VC's on hand placement Stand to Sit: To chair/3-in-1;1: +2 Total assist Stand to Sit Details: Total assist + 2 pt 65% with 75% VC's on hand placement and upright posture Ambulation/Gait Ambulation/Gait: Yes Ambulation/Gait Assistance: 1: +2 Total assist Ambulation/Gait Assistance Details (indicate cue type and reason): total assist + 2 pt 65% with increased time and 75% VC's to encourage amb distance and proper walker to self distance Ambulation Distance (Feet): 12 Feet Assistive device: Rolling walker Gait Pattern: Step-to pattern;Decreased stance time - right;Trunk flexed;Shuffle Gait velocity: sluggish, slow with mild c/o feeling "goofy' Stairs: No Wheelchair Mobility Wheelchair Mobility: No    End of Session PT - End of Session Equipment Utilized During Treatment: Gait belt Activity Tolerance: Patient tolerated treatment well Patient left: in chair;with call bell in reach;with family/visitor present General Behavior During Session: [facility] for tasks performed Cognition: [facility] for tasks performed  Felecia Shelling  PTA [facility]  Acute  Rehab Pager     [phone]

## 2011-09-19 NOTE — Progress Notes (Signed)
Spoke with Judeth Cornfield at [facility] who stated that their facility is extending a bed offer to Pt and requested that CSW fax Pt's d/c summary to her attention at [phone].  Judeth Cornfield requesting that daughter come by and sign the paperwork today.  Notified Pt and daughter, Lupe Carney, who was in Pt's room.  Pt and daughter to review bed offers and contact CSW with choice.  Providence Crosby, LCSWA Clinical Social Work [phone]

## 2011-09-19 NOTE — Discharge Summary (Signed)
Physician Discharge Summary   Patient ID: Maria George MRN: [id number] DOB/AGE: 1940/09/04 71 y.o.  Admit date: 09/17/2011 Discharge date: 09/20/2011  Primary Diagnosis: Osteoarthritis Right Knee  Admission Diagnoses: Past Medical History  Diagnosis Date  . Hypertension   . Osteoporosis   . Migraine   . Depression    Discharge Diagnoses:  Principal Problem:  *OA (osteoarthritis) of knee  Procedure: Procedure(s) (LRB): TOTAL KNEE ARTHROPLASTY (Right)   Consults: None  HPI: Maria George is a 71 y.o. year old female with end stage OA of her right knee with progressively worsening pain and dysfunction. She has constant pain, with activity and at rest and significant functional deficits with difficulties even with ADLs. She has had extensive non-op management including analgesics, injections of cortisone and viscosupplements, and home exercise program, but remains in significant pain with significant dysfunction.Radiographs show bone on bone arthritis lateral and patellofemoral with subchondral sclerosis laterally. She presents now for left Total Knee Arthroplasty.   Laboratory Data: Hospital Outpatient Visit on 09/15/2011  Component Date Value Range Status  . aPTT (seconds) 09/15/2011 38* 24-37 Final   Comment:                                 IF BASELINE aPTT IS ELEVATED,                          SUGGEST PATIENT RISK ASSESSMENT                          BE USED TO DETERMINE APPROPRIATE                          ANTICOAGULANT THERAPY.  . WBC (K/uL) 09/15/2011 8.0  4.0-10.5 Final  . RBC (MIL/uL) 09/15/2011 4.27  3.87-5.11 Final  . Hemoglobin (g/dL) 40/98/1191 47.8  29.5-62.1 Final  . HCT (%) 09/15/2011 39.0  36.0-46.0 Final  . MCV (fL) 09/15/2011 91.3  78.0-100.0 Final  . MCH (pg) 09/15/2011 30.4  26.0-34.0 Final  . MCHC (g/dL) 30/86/5784 69.6  29.5-28.4 Final  . RDW (%) 09/15/2011 13.2  11.5-15.5 Final  . Platelets (K/uL) 09/15/2011 324  150-400 Final  . Sodium  (mEq/L) 09/15/2011 138  135-145 Final  . Potassium (mEq/L) 09/15/2011 3.9  3.5-5.1 Final  . Chloride (mEq/L) 09/15/2011 102  96-112 Final  . CO2 (mEq/L) 09/15/2011 28  19-32 Final  . Glucose, Bld (mg/dL) 13/24/4010 87  27-25 Final  . BUN (mg/dL) 36/64/4034 17  7-42 Final  . Creatinine, Ser (mg/dL) 59/56/3875 6.43  3.29-5.18 Final  . Calcium (mg/dL) 84/16/6063 9.7  0.1-60.1 Final  . Total Protein (g/dL) 09/32/3557 7.5  3.2-2.0 Final  . Albumin (g/dL) 25/42/7062 3.4* 3.7-6.2 Final  . AST (U/L) 09/15/2011 22  0-37 Final  . ALT (U/L) 09/15/2011 19  0-35 Final  . Alkaline Phosphatase (U/L) 09/15/2011 105  39-117 Final  . Total Bilirubin (mg/dL) 83/15/1761 0.3  6.0-7.3 Final  . GFR calc non Af Amer (mL/min) 09/15/2011 57* >90 Final  . GFR calc Af Amer (mL/min) 09/15/2011 66* >90 Final   Comment:                                 The eGFR has been calculated  using the CKD EPI equation.                          This calculation has not been                          validated in all clinical                          situations.                          eGFR's persistently                          <90 mL/min signify                          possible Chronic Kidney Disease.  Marland Kitchen Prothrombin Time (seconds) 09/15/2011 12.6  11.6-15.2 Final  . INR  09/15/2011 0.92  0.00-1.49 Final  . ABO/RH(D)  09/15/2011 A POS   Final  . Antibody Screen  09/15/2011 NEG   Final  . Sample Expiration  09/15/2011 09/20/2011   Final  . Color, Urine  09/15/2011 YELLOW  YELLOW Final  . APPearance  09/15/2011 CLEAR  CLEAR Final  . Specific Gravity, Urine  09/15/2011 1.020  1.005-1.030 Final  . pH  09/15/2011 6.0  5.0-8.0 Final  . Glucose, UA (mg/dL) 45/40/9811 NEGATIVE  NEGATIVE Final  . Hgb urine dipstick  09/15/2011 NEGATIVE  NEGATIVE Final  . Bilirubin Urine  09/15/2011 NEGATIVE  NEGATIVE Final  . Ketones, ur (mg/dL) 91/47/8295 NEGATIVE  NEGATIVE Final  . Protein, ur (mg/dL) 62/13/0865  NEGATIVE  NEGATIVE Final  . Urobilinogen, UA (mg/dL) 78/46/9629 1.0  5.2-8.4 Final  . Nitrite  09/15/2011 NEGATIVE  NEGATIVE Final  . Leukocytes, UA  09/15/2011 SMALL* NEGATIVE Final  . MRSA, PCR  09/15/2011 NEGATIVE  NEGATIVE Final  . Staphylococcus aureus  09/15/2011 NEGATIVE  NEGATIVE Final   Comment:                                 The Xpert SA Assay (FDA                          approved for NASAL specimens                          only), is one component of                          a comprehensive surveillance                          program.  It is not intended                          to diagnose infection nor to                          guide or monitor treatment.  . Squamous Epithelial / LPF  09/15/2011 MANY* RARE Final  . WBC, UA (WBC/hpf) 09/15/2011 3-6  <3 Final  .  RBC / HPF (RBC/hpf) 09/15/2011 0-2  <3 Final  . Bacteria, UA  09/15/2011 FEW* RARE Final    Basename 09/19/11 0422 09/18/11 0414  HGB 9.6* 10.5*    Basename 09/19/11 0422 09/18/11 0414  WBC 11.9* 10.2  RBC 3.16* 3.46*  HCT 29.3* 31.9*  PLT 260 293    Basename 09/19/11 0422 09/18/11 0414  NA 139 135  K 4.0 5.1  CL 105 101  CO2 27 24  BUN 18 17  CREATININE 1.05 1.00  GLUCOSE 105* 160*  CALCIUM 8.4 8.7   No results found for this basename: LABPT:2,INR:2 in the last 72 hours  X-Rays:No results found.  EKG: Orders placed during the hospital encounter of 05/05/11  . EKG     Hospital Course: Patient was admitted to [facility] and taken to the OR and underwent the above state procedure without complications.  Patient tolerated the procedure well and was later transferred to the recovery room and then to the orthopaedic floor for postoperative care.  They were given PO and IV analgesics for pain control following their surgery.  They were given 24 hours of postoperative antibiotics and started on DVT prophylaxis.   PT and OT were ordered for total joint protocol.  Discharge planning  consulted to help with postop disposition and equipment needs.  Patient had a decent night on the evening of surgery but did have some pain and started to get up with therapy on day one.  PCA was discontinued and they were weaned over to PO meds.  Hemovac drain was pulled without difficulty.  Continued to progress with therapy into day two.  She was noted to have some increased pain on day two since she did fairly well with her therapy on day one.  Dressing was changed on day two and the incision was healing well and looking good. Lebron Quam, LCSW Social Worker spoke with Judeth Cornfield at [facility] who stated that their facility is extending a bed offer to Pt and requested that CSW fax Pt's d/c summary to her attention at [phone]. Judeth Cornfield requesting that daughter come by and sign the paperwork today. In preparation of the transfer, this summary being completed in anticipation of the bed being ready and the facility accepting the patient on Saturday, tomorrow, 09/20/2011.  As long as she is progressing and doing well and the bed is ready and the facility accepts on Saturday, she will transfer over tomorrow to the facility.  Discharge Medications: Prior to Admission medications   Medication Sig Start Date End Date Taking? Authorizing Provider  clonazePAM (KLONOPIN) 1 MG tablet Take 1 mg by mouth 2 (two) times daily as needed. Anxiety. Pt takes 1 tablet every day in the morning.    Yes Historical Provider, MD  cyclobenzaprine (FLEXERIL) 10 MG tablet Take 10 mg by mouth 3 (three) times daily as needed. Muscle spasm    Yes Historical Provider, MD  gabapentin (NEURONTIN) 300 MG capsule Take 600 mg by mouth 2 (two) times daily.   Yes Historical Provider, MD  hydrOXYzine (ATARAX/VISTARIL) 50 MG tablet Take 50-100 mg by mouth 3 (three) times daily as needed. Itching    Yes Historical Provider, MD  lisinopril-hydrochlorothiazide (PRINZIDE,ZESTORETIC) 20-25 MG per tablet Take 1 tablet by mouth  daily before breakfast.   Yes Historical Provider, MD  venlafaxine (EFFEXOR-XR) 150 MG 24 hr capsule Take 300 mg by mouth daily before breakfast.   Yes Historical Provider, MD  zolpidem (AMBIEN) 10 MG tablet Take 10 mg by  mouth at bedtime.   Yes Historical Provider, MD  acetaminophen (TYLENOL) 325 MG tablet Take 2 tablets (650 mg total) by mouth every 6 (six) hours as needed (or Fever >/= 101). 09/19/11 09/18/12  Nycole Kawahara, PA  docusate sodium 100 MG CAPS Take 100 mg by mouth 2 (two) times daily. 09/19/11 09/29/11  Kess Mcilwain, PA  methocarbamol (ROBAXIN) 500 MG tablet Take 1 tablet (500 mg total) by mouth every 6 (six) hours as needed. 09/19/11 09/29/11  Elky Funches, PA  ondansetron (ZOFRAN) 4 MG tablet Take 1 tablet (4 mg total) by mouth every 6 (six) hours as needed for nausea. 09/19/11 09/26/11  Emidio Warrell, PA  oxyCODONE (OXY IR/ROXICODONE) 5 MG immediate release tablet Take 1-2 tablets (5-10 mg total) by mouth every 4 (four) hours as needed for pain. 09/19/11 09/29/11  Analiya Porco, PA  polysaccharide iron (NIFEREX) 150 MG CAPS capsule Take 1 capsule (150 mg total) by mouth daily. 09/19/11   Chaska Hagger Julien Girt, PA  rivaroxaban (XARELTO) 10 MG TABS tablet Take 1 tablet (10 mg total) by mouth daily with breakfast. 09/19/11   Lawerence Dery Julien Girt, PA    Diet: heart healthy  Activity:WBAT  Follow-up:in 2 weeks  Disposition: [employer] Discharged Condition: stable   Discharge Orders    Future Orders Please Complete By Expires   Diet - low sodium heart healthy      Call MD / Call 911      Comments:   If you experience chest pain or shortness of breath, CALL 911 and be transported to the hospital emergency room.  If you develope a fever above 101 F, pus (white drainage) or increased drainage or redness at the wound, or calf pain, call your surgeon's office.   Constipation Prevention      Comments:   Drink plenty of fluids.  Prune juice may be helpful.  You may  use a stool softener, such as Colace (over the counter) 100 mg twice a day.  Use MiraLax (over the counter) for constipation as needed.   Increase activity slowly as tolerated      Weight Bearing as taught in Physical Therapy      Comments:   Use a walker or crutches as instructed.   Discharge instructions      Comments:   Pick up stool softner and laxative for home. Do not submerge incision under water. May shower. Continue to use ice for pain and swelling from surgery.    Driving restrictions      Comments:   No driving   Lifting restrictions      Comments:   No lifting   TED hose      Comments:   Use stockings (TED hose) for 3 weeks on both leg(s).   Do not put a pillow under the knee. Place it under the heel.      Change dressing      Comments:   Change dressing daily with sterile 4 x 4 inch gauze dressing and apply TED hose.     Medication List  As of 09/19/2011  1:05 PM   STOP taking these medications         ibuprofen 200 MG tablet         TAKE these medications         acetaminophen 325 MG tablet   Commonly known as: TYLENOL   Take 2 tablets (650 mg total) by mouth every 6 (six) hours as needed (or Fever >/= 101).  clonazePAM 1 MG tablet   Commonly known as: KLONOPIN   Take 1 mg by mouth 2 (two) times daily as needed. Anxiety. Pt takes 1 tablet every day in the morning.        cyclobenzaprine 10 MG tablet   Commonly known as: FLEXERIL   Take 10 mg by mouth 3 (three) times daily as needed. Muscle spasm        DSS 100 MG Caps   Take 100 mg by mouth 2 (two) times daily.      gabapentin 300 MG capsule   Commonly known as: NEURONTIN   Take 600 mg by mouth 2 (two) times daily.      hydrOXYzine 50 MG tablet   Commonly known as: ATARAX/VISTARIL   Take 50-100 mg by mouth 3 (three) times daily as needed. Itching        lisinopril-hydrochlorothiazide 20-25 MG per tablet   Commonly known as: PRINZIDE,ZESTORETIC   Take 1 tablet by mouth daily before  breakfast.      methocarbamol 500 MG tablet   Commonly known as: ROBAXIN   Take 1 tablet (500 mg total) by mouth every 6 (six) hours as needed.      ondansetron 4 MG tablet   Commonly known as: ZOFRAN   Take 1 tablet (4 mg total) by mouth every 6 (six) hours as needed for nausea.      oxyCODONE 5 MG immediate release tablet   Commonly known as: Oxy IR/ROXICODONE   Take 1-2 tablets (5-10 mg total) by mouth every 4 (four) hours as needed for pain.      polysaccharide iron 150 MG Caps capsule   Commonly known as: NIFEREX   Take 1 capsule (150 mg total) by mouth daily.      rivaroxaban 10 MG Tabs tablet   Commonly known as: XARELTO   Take 1 tablet (10 mg total) by mouth daily with breakfast.      venlafaxine 150 MG 24 hr capsule   Commonly known as: EFFEXOR-XR   Take 300 mg by mouth daily before breakfast.      zolpidem 10 MG tablet   Commonly known as: AMBIEN   Take 10 mg by mouth at bedtime.           Follow-up Information    Follow up with Loanne Drilling, MD. Schedule an appointment as soon as possible for a visit in 2 weeks. (Please have staff help arrange follow up and transportation for patient.)    Contact information:   [facility] [address] [place] [address] [phone]          Signed: Patrica Duel 09/19/2011, 1:05 PM

## 2011-09-20 LAB — BASIC METABOLIC PANEL
CO2: 28 mEq/L (ref 19–32)
Chloride: 99 mEq/L (ref 96–112)
Creatinine, Ser: 0.96 mg/dL (ref 0.50–1.10)

## 2011-09-20 LAB — CBC
MCV: 92 fL (ref 78.0–100.0)
Platelets: 260 10*3/uL (ref 150–400)
RBC: 3 MIL/uL — ABNORMAL LOW (ref 3.87–5.11)
WBC: 10.2 10*3/uL (ref 4.0–10.5)

## 2011-09-20 NOTE — Progress Notes (Signed)
Pt is ready for discharge today. Pt will be discharging to [facility]. Facility has received discharge summary and is ready to admit pt. Pt is agreeable to discharge plan. CSW left a vm with pt's daugther. Per bedside RN, pt's daughter is aware and will be meeting with pt at the facility. PTAR will providing PTAR. CSW is signing off as pt is discharging.   Dede Query, MSW, [facility] Weekend Coverage [phone]

## 2011-09-20 NOTE — Progress Notes (Signed)
Subjective: Patient laying in bed denies any complaints or problems with than soreness in her knee. No shortness of breath chest discomfort nausea and   Objective: Vital signs in last 24 hours: Temp:  [98.6 F (37 C)-99.3 F (37.4 C)] 98.6 F (37 C) (02/02 0612) Pulse Rate:  [99-111] 99  (02/02 0612) Resp:  [18-20] 20  (02/02 0612) BP: (115-151)/(75-86) 142/77 mmHg (02/02 0612) SpO2:  [95 %-98 %] 97 % (02/02 0612)  Intake/Output from previous day: 02/01 0701 - 02/02 0700 In: 480 [P.O.:480] Out: 2450 [Urine:2450] Intake/Output this shift:     Basename 09/20/11 0402 09/19/11 0422 09/18/11 0414  HGB 9.0* 9.6* 10.5*    Basename 09/20/11 0402 09/19/11 0422  WBC 10.2 11.9*  RBC 3.00* 3.16*  HCT 27.6* 29.3*  PLT 260 260    Basename 09/20/11 0402 09/19/11 0422  NA 133* 139  K 4.0 4.0  CL 99 105  CO2 28 27  BUN 10 18  CREATININE 0.96 1.05  GLUCOSE 103* 105*  CALCIUM 8.6 8.4   No results found for this basename: LABPT:2,INR:2 in the last 72 hours  Patient laying in bed conscious alert appropriate appears to be in no distress her right knee she does have little bit of a blister on the lateral aspects covered with Tegaderm. The wound is well approximated with S. Steri-Strips no drainage no signs of infection dressing changed calf and thigh are soft and nontender her leg is neuromotor vascularly intact  Assessment/Plan: Postop day #3 status post right total knee arthroplasty weightbearing status 25-50% doing well Post transfusion for postop blood loss anemia doing well Will progress with physical therapy arrangements made for skilled nursing facility will discharge that facility day accepted   Whitman Meinhardt W 09/20/2011, 8:40 AM

## 2011-09-20 NOTE — Progress Notes (Signed)
Physical Therapy Treatment Patient Details Name: Maria George MRN: [id number] DOB: 1940/09/12 Today's Date: 09/20/2011 1te 1gt PT Assessment/Plan  PT - Assessment/Plan PT Plan: Discharge plan remains appropriate Follow Up Recommendations: Skilled nursing facility Equipment Recommended: Defer to next venue PT Goals  Acute Rehab PT Goals Pt will go Supine/Side to Sit: with supervision PT Goal: Supine/Side to Sit - Progress: Progressing toward goal Pt will go Sit to Stand: with supervision PT Goal: Sit to Stand - Progress: Progressing toward goal Pt will go Stand to Sit: with supervision PT Goal: Stand to Sit - Progress: Progressing toward goal Pt will Ambulate: 51 - 150 feet;with supervision;with rolling walker PT Goal: Ambulate - Progress: Progressing toward goal Pt will Perform Home Exercise Program: with supervision, verbal cues required/provided PT Goal: Perform Home Exercise Program - Progress: Progressing toward goal  PT Treatment Precautions/Restrictions  Precautions Precautions: Knee Precaution Comments: Pt instructed on KI use and when to D/C Required Braces or Orthoses: Yes Knee Immobilizer: Discontinue once straight leg raise with < 10 degree lag Restrictions Weight Bearing Restrictions: No Other Position/Activity Restrictions: WBAT Mobility (including Balance) Bed Mobility Supine to Sit: 3: Mod assist;2: Max assist Supine to Sit Details (indicate cue type and reason): HOB elevated 45' and increased time to scoot to EOB; assist with UB and RLE Transfers Sit to Stand: 2: Max assist;With upper extremity assist;From bed Sit to Stand Details (indicate cue type and reason): cues to use UEs and increase time to complete Stand to Sit: 3: Mod assist;To chair/3-in-1;With upper extremity assist Stand to Sit Details: vc to place RLE forward, reach for armrests Ambulation/Gait Ambulation/Gait Assistance: 3: Mod assist Ambulation/Gait Assistance Details (indicate cue type  and reason): vc for sequence posture Ambulation Distance (Feet): 34 Feet Assistive device: Rolling walker Gait Pattern: Step-to pattern Gait velocity: slow    Exercise  Total Joint Exercises Ankle Circles/Pumps: AROM;Both;10 reps Quad Sets: AROM;Both;10 reps Gluteal Sets: AROM;Both;10 reps Heel Slides: AAROM;Right;5 reps Straight Leg Raises: AAROM;Right;10 reps End of Session PT - End of Session Equipment Utilized During Treatment: Gait belt Activity Tolerance: Patient limited by fatigue Patient left: in chair;with call bell in reach Nurse Communication: Mobility status for transfers General Behavior During Session: [facility] for tasks performed Cognition: [facility] for tasks performed  [facility] 09/20/2011, 10:07 AM

## 2011-11-10 ENCOUNTER — Ambulatory Visit: Payer: Medicare Other | Attending: Orthopedic Surgery | Admitting: Physical Therapy

## 2011-11-10 DIAGNOSIS — M25669 Stiffness of unspecified knee, not elsewhere classified: Secondary | ICD-10-CM | POA: Insufficient documentation

## 2011-11-10 DIAGNOSIS — IMO0001 Reserved for inherently not codable concepts without codable children: Secondary | ICD-10-CM | POA: Insufficient documentation

## 2011-11-10 DIAGNOSIS — R5381 Other malaise: Secondary | ICD-10-CM | POA: Insufficient documentation

## 2011-11-10 DIAGNOSIS — M25569 Pain in unspecified knee: Secondary | ICD-10-CM | POA: Insufficient documentation

## 2011-11-13 ENCOUNTER — Ambulatory Visit: Payer: Medicare Other | Admitting: Physical Therapy

## 2011-11-17 ENCOUNTER — Ambulatory Visit: Payer: Medicare Other | Attending: Orthopedic Surgery | Admitting: Physical Therapy

## 2011-11-17 DIAGNOSIS — R5381 Other malaise: Secondary | ICD-10-CM | POA: Insufficient documentation

## 2011-11-17 DIAGNOSIS — IMO0001 Reserved for inherently not codable concepts without codable children: Secondary | ICD-10-CM | POA: Insufficient documentation

## 2011-11-17 DIAGNOSIS — M25569 Pain in unspecified knee: Secondary | ICD-10-CM | POA: Insufficient documentation

## 2011-11-17 DIAGNOSIS — M25669 Stiffness of unspecified knee, not elsewhere classified: Secondary | ICD-10-CM | POA: Insufficient documentation

## 2011-11-19 ENCOUNTER — Ambulatory Visit: Payer: Medicare Other | Admitting: Physical Therapy

## 2011-11-20 ENCOUNTER — Ambulatory Visit: Payer: Medicare Other | Admitting: Physical Therapy

## 2011-11-24 ENCOUNTER — Ambulatory Visit: Payer: Medicare Other | Admitting: Physical Therapy

## 2011-11-25 ENCOUNTER — Ambulatory Visit: Payer: Medicare Other | Admitting: *Deleted

## 2011-11-27 ENCOUNTER — Ambulatory Visit: Payer: Medicare Other | Admitting: *Deleted

## 2011-11-27 ENCOUNTER — Encounter: Payer: Medicare Other | Admitting: Physical Therapy

## 2011-12-01 ENCOUNTER — Ambulatory Visit: Payer: Medicare Other | Admitting: Physical Therapy

## 2011-12-02 ENCOUNTER — Encounter: Payer: Medicare Other | Admitting: *Deleted

## 2011-12-04 ENCOUNTER — Encounter: Payer: Medicare Other | Admitting: *Deleted

## 2012-04-18 HISTORY — PX: JOINT REPLACEMENT: SHX530

## 2012-06-27 ENCOUNTER — Emergency Department (HOSPITAL_COMMUNITY): Payer: Medicare Other

## 2012-06-27 ENCOUNTER — Encounter (HOSPITAL_COMMUNITY): Payer: Self-pay | Admitting: *Deleted

## 2012-06-27 ENCOUNTER — Emergency Department (HOSPITAL_COMMUNITY)
Admission: EM | Admit: 2012-06-27 | Discharge: 2012-06-27 | Disposition: A | Discharge status: Home / Self Care | Payer: Medicare Other | Attending: Emergency Medicine | Admitting: Emergency Medicine

## 2012-06-27 DIAGNOSIS — Y9389 Activity, other specified: Secondary | ICD-10-CM | POA: Insufficient documentation

## 2012-06-27 DIAGNOSIS — S139XXA Sprain of joints and ligaments of unspecified parts of neck, initial encounter: Secondary | ICD-10-CM | POA: Insufficient documentation

## 2012-06-27 DIAGNOSIS — M810 Age-related osteoporosis without current pathological fracture: Secondary | ICD-10-CM | POA: Insufficient documentation

## 2012-06-27 DIAGNOSIS — N289 Disorder of kidney and ureter, unspecified: Secondary | ICD-10-CM | POA: Insufficient documentation

## 2012-06-27 DIAGNOSIS — Z87891 Personal history of nicotine dependence: Secondary | ICD-10-CM | POA: Insufficient documentation

## 2012-06-27 DIAGNOSIS — Z8659 Personal history of other mental and behavioral disorders: Secondary | ICD-10-CM | POA: Insufficient documentation

## 2012-06-27 DIAGNOSIS — Y929 Unspecified place or not applicable: Secondary | ICD-10-CM | POA: Insufficient documentation

## 2012-06-27 DIAGNOSIS — I10 Essential (primary) hypertension: Secondary | ICD-10-CM | POA: Insufficient documentation

## 2012-06-27 DIAGNOSIS — Z79899 Other long term (current) drug therapy: Secondary | ICD-10-CM | POA: Insufficient documentation

## 2012-06-27 DIAGNOSIS — S161XXA Strain of muscle, fascia and tendon at neck level, initial encounter: Secondary | ICD-10-CM

## 2012-06-27 DIAGNOSIS — R4182 Altered mental status, unspecified: Secondary | ICD-10-CM | POA: Insufficient documentation

## 2012-06-27 DIAGNOSIS — Z8679 Personal history of other diseases of the circulatory system: Secondary | ICD-10-CM | POA: Insufficient documentation

## 2012-06-27 LAB — URINALYSIS, ROUTINE W REFLEX MICROSCOPIC
Glucose, UA: NEGATIVE mg/dL
Ketones, ur: NEGATIVE mg/dL
Specific Gravity, Urine: 1.01 (ref 1.005–1.030)
pH: 6.5 (ref 5.0–8.0)

## 2012-06-27 LAB — POCT I-STAT, CHEM 8
Hemoglobin: 15.6 g/dL — ABNORMAL HIGH (ref 12.0–15.0)
Sodium: 139 mEq/L (ref 135–145)
TCO2: 26 mmol/L (ref 0–100)

## 2012-06-27 LAB — URINE MICROSCOPIC-ADD ON

## 2012-06-27 MED ORDER — OXYCODONE-ACETAMINOPHEN 5-325 MG PO TABS: 1.0000 | ORAL_TABLET | Freq: Once | ORAL | Status: DC

## 2012-06-27 NOTE — ED Notes (Signed)
LSB and head blocks removed by 2 RNS and 1 tech. Pt's gcs 15

## 2012-06-27 NOTE — ED Notes (Signed)
MD at bedside. 
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
## 2012-06-27 NOTE — ED Notes (Signed)
c-collar removed per MD request 
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
## 2012-06-27 NOTE — ED Notes (Addendum)
Pt reports taking her medications at 0730 this morning: 2 300mg  neurontin, 2 150mg  effexor-xr, 1 1mg  klonopin, and 1 5-325 hydrocodone
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
## 2012-06-27 NOTE — ED Notes (Signed)
Patient transported to CT 

## 2013-01-19 ENCOUNTER — Ambulatory Visit: Payer: Medicare Other | Admitting: Physical Therapy

## 2013-01-24 ENCOUNTER — Ambulatory Visit: Payer: Medicare Other | Admitting: Physical Therapy

## 2013-02-07 ENCOUNTER — Ambulatory Visit: Payer: Medicare Other | Attending: Orthopedic Surgery | Admitting: Physical Therapy

## 2013-02-07 DIAGNOSIS — M6281 Muscle weakness (generalized): Secondary | ICD-10-CM | POA: Insufficient documentation

## 2013-02-07 DIAGNOSIS — Z96659 Presence of unspecified artificial knee joint: Secondary | ICD-10-CM | POA: Insufficient documentation

## 2013-02-07 DIAGNOSIS — IMO0001 Reserved for inherently not codable concepts without codable children: Secondary | ICD-10-CM | POA: Insufficient documentation

## 2013-02-07 DIAGNOSIS — R5381 Other malaise: Secondary | ICD-10-CM | POA: Insufficient documentation

## 2013-02-10 ENCOUNTER — Ambulatory Visit: Payer: Medicare Other | Admitting: Physical Therapy

## 2013-02-14 ENCOUNTER — Encounter: Payer: Medicare Other | Admitting: Physical Therapy

## 2013-02-15 ENCOUNTER — Encounter: Payer: Medicare Other | Admitting: Physical Therapy

## 2013-02-17 ENCOUNTER — Encounter: Payer: Medicare Other | Admitting: Physical Therapy

## 2013-02-21 ENCOUNTER — Ambulatory Visit: Payer: Medicare Other | Attending: Orthopedic Surgery | Admitting: Physical Therapy

## 2013-02-21 DIAGNOSIS — R5381 Other malaise: Secondary | ICD-10-CM | POA: Insufficient documentation

## 2013-02-21 DIAGNOSIS — M6281 Muscle weakness (generalized): Secondary | ICD-10-CM | POA: Insufficient documentation

## 2013-02-21 DIAGNOSIS — Z96659 Presence of unspecified artificial knee joint: Secondary | ICD-10-CM | POA: Insufficient documentation

## 2013-02-21 DIAGNOSIS — IMO0001 Reserved for inherently not codable concepts without codable children: Secondary | ICD-10-CM | POA: Insufficient documentation

## 2013-02-28 ENCOUNTER — Ambulatory Visit: Payer: Medicare Other | Admitting: Physical Therapy

## 2013-03-03 ENCOUNTER — Ambulatory Visit: Payer: Medicare Other | Admitting: Physical Therapy

## 2013-03-07 ENCOUNTER — Ambulatory Visit: Payer: Medicare Other | Admitting: Physical Therapy

## 2013-03-10 ENCOUNTER — Encounter: Payer: Medicare Other | Admitting: Physical Therapy

## 2013-03-14 ENCOUNTER — Encounter: Payer: Medicare Other | Admitting: Physical Therapy

## 2013-03-17 ENCOUNTER — Encounter: Payer: Medicare Other | Admitting: Physical Therapy

## 2013-03-21 ENCOUNTER — Encounter: Payer: Medicare Other | Admitting: Physical Therapy

## 2013-03-24 ENCOUNTER — Encounter: Payer: Medicare Other | Admitting: Physical Therapy

## 2013-04-01 ENCOUNTER — Ambulatory Visit: Payer: Medicare Other | Attending: Orthopedic Surgery | Admitting: *Deleted

## 2013-04-01 DIAGNOSIS — Z96659 Presence of unspecified artificial knee joint: Secondary | ICD-10-CM | POA: Insufficient documentation

## 2013-04-01 DIAGNOSIS — R5381 Other malaise: Secondary | ICD-10-CM | POA: Insufficient documentation

## 2013-04-01 DIAGNOSIS — IMO0001 Reserved for inherently not codable concepts without codable children: Secondary | ICD-10-CM | POA: Insufficient documentation

## 2013-04-01 DIAGNOSIS — M6281 Muscle weakness (generalized): Secondary | ICD-10-CM | POA: Insufficient documentation

## 2013-04-05 ENCOUNTER — Encounter: Payer: Medicare Other | Admitting: Physical Therapy

## 2013-04-07 ENCOUNTER — Encounter: Payer: Medicare Other | Admitting: Physical Therapy

## 2013-04-12 ENCOUNTER — Ambulatory Visit: Payer: Medicare Other | Admitting: *Deleted

## 2013-04-14 ENCOUNTER — Encounter: Payer: Medicare Other | Admitting: *Deleted

## 2013-04-21 ENCOUNTER — Ambulatory Visit: Payer: Medicare Other | Attending: Orthopedic Surgery | Admitting: Physical Therapy

## 2013-04-21 DIAGNOSIS — IMO0001 Reserved for inherently not codable concepts without codable children: Secondary | ICD-10-CM | POA: Insufficient documentation

## 2013-04-21 DIAGNOSIS — R5381 Other malaise: Secondary | ICD-10-CM | POA: Insufficient documentation

## 2013-04-21 DIAGNOSIS — Z96659 Presence of unspecified artificial knee joint: Secondary | ICD-10-CM | POA: Insufficient documentation

## 2013-04-21 DIAGNOSIS — M6281 Muscle weakness (generalized): Secondary | ICD-10-CM | POA: Insufficient documentation

## 2013-04-26 ENCOUNTER — Ambulatory Visit: Payer: Medicare Other | Admitting: *Deleted

## 2013-04-28 ENCOUNTER — Encounter: Payer: Medicare Other | Admitting: *Deleted

## 2013-05-03 ENCOUNTER — Ambulatory Visit: Payer: Medicare Other | Admitting: *Deleted

## 2013-05-05 ENCOUNTER — Ambulatory Visit: Payer: Medicare Other | Admitting: *Deleted

## 2013-05-17 ENCOUNTER — Ambulatory Visit: Payer: Medicare Other | Admitting: *Deleted

## 2013-05-19 ENCOUNTER — Encounter: Payer: Medicare Other | Admitting: *Deleted

## 2013-05-24 ENCOUNTER — Encounter: Payer: Medicare Other | Admitting: *Deleted

## 2013-05-26 ENCOUNTER — Encounter: Payer: Medicare Other | Admitting: *Deleted

## 2013-06-24 ENCOUNTER — Encounter (HOSPITAL_COMMUNITY): Payer: Self-pay | Admitting: Pharmacy Technician

## 2013-06-29 ENCOUNTER — Encounter (HOSPITAL_COMMUNITY)
Admission: RE | Admit: 2013-06-29 | Discharge: 2013-06-29 | Disposition: A | Discharge status: Home / Self Care | Payer: Medicare Other | Source: Ambulatory Visit | Attending: Orthopedic Surgery | Admitting: Orthopedic Surgery

## 2013-06-29 ENCOUNTER — Encounter (HOSPITAL_COMMUNITY): Payer: Self-pay

## 2013-06-29 ENCOUNTER — Ambulatory Visit (HOSPITAL_COMMUNITY)
Admission: RE | Admit: 2013-06-29 | Discharge: 2013-06-29 | Disposition: A | Discharge status: Home / Self Care | Payer: Medicare Other | Source: Ambulatory Visit | Attending: Orthopedic Surgery | Admitting: Orthopedic Surgery

## 2013-06-29 DIAGNOSIS — Z01818 Encounter for other preprocedural examination: Secondary | ICD-10-CM | POA: Insufficient documentation

## 2013-06-29 DIAGNOSIS — G4733 Obstructive sleep apnea (adult) (pediatric): Secondary | ICD-10-CM | POA: Insufficient documentation

## 2013-06-29 HISTORY — DX: Gastro-esophageal reflux disease without esophagitis: K21.9

## 2013-06-29 HISTORY — DX: Unspecified osteoarthritis, unspecified site: M19.90

## 2013-06-29 LAB — SURGICAL PCR SCREEN
MRSA, PCR: NEGATIVE
Staphylococcus aureus: NEGATIVE

## 2013-06-29 LAB — COMPREHENSIVE METABOLIC PANEL
AST: 28 U/L (ref 0–37)
BUN: 25 mg/dL — ABNORMAL HIGH (ref 6–23)
CO2: 27 mEq/L (ref 19–32)
Calcium: 9.9 mg/dL (ref 8.4–10.5)
Chloride: 96 mEq/L (ref 96–112)
Creatinine, Ser: 1.52 mg/dL — ABNORMAL HIGH (ref 0.50–1.10)
GFR calc Af Amer: 38 mL/min — ABNORMAL LOW (ref >90)
GFR calc non Af Amer: 33 mL/min — ABNORMAL LOW (ref >90)
Glucose, Bld: 76 mg/dL (ref 70–99)
Sodium: 135 mEq/L (ref 135–145)
Total Bilirubin: 0.4 mg/dL (ref 0.3–1.2)
Total Protein: 7.7 g/dL (ref 6.0–8.3)

## 2013-06-29 LAB — CBC
HCT: 39.7 % (ref 36.0–46.0)
Hemoglobin: 13.9 g/dL (ref 12.0–15.0)
MCH: 32.8 pg (ref 26.0–34.0)
MCV: 93.6 fL (ref 78.0–100.0)
Platelets: 287 10*3/uL (ref 150–400)
RBC: 4.24 MIL/uL (ref 3.87–5.11)
RDW: 12.1 % (ref 11.5–15.5)
WBC: 8.6 10*3/uL (ref 4.0–10.5)

## 2013-06-29 LAB — DIFFERENTIAL
Basophils Absolute: 0.1 10*3/uL (ref 0.0–0.1)
Eosinophils Absolute: 0.3 10*3/uL (ref 0.0–0.7)
Eosinophils Relative: 4 % (ref 0–5)
Lymphocytes Relative: 28 % (ref 12–46)
Lymphs Abs: 2.4 10*3/uL (ref 0.7–4.0)
Monocytes Absolute: 0.8 10*3/uL (ref 0.1–1.0)
Monocytes Relative: 9 % (ref 3–12)

## 2013-06-29 NOTE — Pre-Procedure Instructions (Signed)
KENZY CAMPOVERDE  06/29/2013   Your procedure is scheduled on: Friday, November  21st   Report to [facility]  2 * 3 at  9:55 AM.   Call this number if you have problems the morning of surgery: [phone]   Remember:   Do not eat food or drink liquids after midnight Thursday.   Take these medicines the morning of surgery with A SIP OF WATER: Clonazepam, Oxycodone, Venlafaxine    Do not wear jewelry, make-up or nail polish.  Do not wear lotions, powders, or perfumes. You may wear deodorant.  Do not shave underarms & legs 48 hours prior to surgery.    Do not bring valuables to the hospital.  [facility] is not responsible for any belongings or valuables.               Contacts, dentures or bridgework may not be worn into surgery.  Leave suitcase in the car. After surgery it may be brought to your room.  For patients admitted to the hospital, discharge time is determined by your treatment team.              Name and phone number of your driver:    Special Instructions: Shower using CHG 2 nights before surgery and the night before surgery.  If you shower the day of surgery use CHG.  Use special wash - you have one bottle of CHG for all showers.  You should use approximately 1/3 of the bottle for each shower.   Please read over the following fact sheets that you were given: Pain Booklet, MRSA Information and Surgical Site Infection Prevention

## 2013-06-29 NOTE — H&P (Signed)
Maria George is an 72 y.o. female.    Chief Complaint: right shoulder pain   HPI: Pt is a 72 y.o. female complaining of right shoulder pain for multiple years. Pain had continually increased since the beginning. 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 Pt has tried various conservative treatments which have failed to alleviate their symptoms, including therapy and injections. Various options are discussed with the patient. Risks, benefits and expectations were discussed with the patient. Patient understand the risks, benefits and expectations and wishes to proceed with surgery.   PCP:  Samuel Jester, DO  D/C Plans:  Home with HHPT  PMH: Past Medical History  Diagnosis Date  . Hypertension   . Osteoporosis   . Migraine   . Depression     PSH: Past Surgical History  Procedure Laterality Date  . Knee arthroscopy  jan 2012    bilateral knees  . Arthrscopy bilateral shoulder  yrs ago    right x 1, left x 1  . Breast biopsy bilateral  yrs ago  . Abdominal hysterectomy  1974  . Cervical laminectomy  1980's  . Lower back surgery  1990's  . Bilateral rotator cuff repair  2003  . Tonsillectomy  1970's  . Total knee arthroplasty  sept 2012  . Rectocle and cystocele repair  late 1980's  . Joint replacement  04/2012    Left Knee  . Total knee arthroplasty  09/17/2011    Procedure: TOTAL KNEE ARTHROPLASTY;  Surgeon: Loanne Drilling, MD;  Location: [facility];  Service: Orthopedics;  Laterality: Right;    Social History:  reports that she quit smoking about 16 years ago. She has never used smokeless tobacco. She reports that she does not drink alcohol or use illicit drugs.  Allergies:  No Known Allergies  Medications: No current facility-administered medications for this encounter.   Current Outpatient Prescriptions  Medication Sig Dispense Refill  . Chlorpheniramine Maleate (ALLERGY RELIEF PO) Take 1 tablet by mouth daily.      . clonazePAM  (KLONOPIN) 1 MG tablet Take 1 mg by mouth daily.       . cyclobenzaprine (FLEXERIL) 10 MG tablet Take 10 mg by mouth 3 (three) times daily as needed. Muscle spasm      . estradiol (ESTRACE) 1 MG tablet Take 1 mg by mouth daily.      . hydrOXYzine (ATARAX/VISTARIL) 50 MG tablet Take 50-100 mg by mouth 3 (three) times daily as needed for itching. Itching       . lisinopril-hydrochlorothiazide (PRINZIDE,ZESTORETIC) 20-25 MG per tablet Take 1 tablet by mouth daily before breakfast.      . oxyCODONE-acetaminophen (PERCOCET/ROXICET) 5-325 MG per tablet Take 1 tablet by mouth every 8 (eight) hours as needed for moderate pain or severe pain.      Marland Kitchen venlafaxine (EFFEXOR-XR) 150 MG 24 hr capsule Take 300 mg by mouth daily before breakfast.      . Vitamin D, Ergocalciferol, (DRISDOL) 50000 UNITS CAPS Take 50,000 Units by mouth every 7 (seven) days.      Marland Kitchen zolpidem (AMBIEN) 10 MG tablet Take 10 mg by mouth at bedtime.        No results found for this or any previous visit (from the past 48 hour(s)). No results found.  ROS: Pain with rom of the right upper extremity  Physical Exam: Alert and oriented 72 y.o. female in no acute distress Cranial nerves 2-12 intact Cervical spine: full rom with no tenderness, nv  intact distally Chest: active breath sounds bilaterally, no wheeze rhonchi or rales Heart: regular rate and rhythm, no murmur Abd: non tender non distended with active bowel sounds Hip is stable with rom  Right upper extremity with moderately decreased rom  Moderate weakness with ER and IR  nv intact distally No rashes or edema  Assessment/Plan Assessment: right shoulder rotator cuff insufficiency  Plan: Patient will undergo a right reverse total shoulder by Dr. Ranell Patrick at [facility]. Risks benefits and expectations were discussed with the patient. Patient understand risks, benefits and expectations and wishes to proceed.

## 2013-06-29 NOTE — Progress Notes (Signed)
Denies heart problems--has seen no cardio.

## 2013-06-29 NOTE — Progress Notes (Signed)
06/29/13 1532  OBSTRUCTIVE SLEEP APNEA  Have you ever been diagnosed with sleep apnea through a sleep study? No  Do you snore loudly (loud enough to be heard through closed doors)?  0  Do you often feel tired, fatigued, or sleepy during the daytime? 1 (d/t  shoulders & knees)  Has anyone observed you stop breathing during your sleep? 0  Do you have, or are you being treated for high blood pressure? 1  BMI more than 35 kg/m2? 1  Age over 72 years old? 1  Neck circumference greater than 40 cm/18 inches? 0  Gender: 0  Obstructive Sleep Apnea Score 4  Score 4 or greater  Results sent to PCP

## 2013-06-30 ENCOUNTER — Other Ambulatory Visit (HOSPITAL_COMMUNITY): Payer: Medicare Other

## 2013-07-07 MED ORDER — CEFAZOLIN SODIUM-DEXTROSE 2-3 GM-% IV SOLR
2.0000 g | INTRAVENOUS | Status: AC
  Administered 2013-07-08: 2 g via INTRAVENOUS
  Filled 2013-07-07: qty 50

## 2013-07-08 ENCOUNTER — Inpatient Hospital Stay (HOSPITAL_COMMUNITY): Payer: Medicare Other | Admitting: Anesthesiology

## 2013-07-08 ENCOUNTER — Inpatient Hospital Stay (HOSPITAL_COMMUNITY): Payer: Medicare Other

## 2013-07-08 ENCOUNTER — Encounter (HOSPITAL_COMMUNITY)
Admission: RE | Disposition: A | Discharge status: Skilled Nursing Facility | Payer: Self-pay | Source: Ambulatory Visit | Attending: Orthopedic Surgery

## 2013-07-08 ENCOUNTER — Inpatient Hospital Stay (HOSPITAL_COMMUNITY)
Admission: RE | Admit: 2013-07-08 | Discharge: 2013-07-12 | DRG: 483 | Disposition: A | Discharge status: Skilled Nursing Facility | Payer: Medicare Other | Source: Ambulatory Visit | Attending: Orthopedic Surgery | Admitting: Orthopedic Surgery

## 2013-07-08 ENCOUNTER — Encounter (HOSPITAL_COMMUNITY): Payer: Self-pay | Admitting: Anesthesiology

## 2013-07-08 ENCOUNTER — Encounter (HOSPITAL_COMMUNITY): Payer: Medicare Other | Admitting: Vascular Surgery

## 2013-07-08 DIAGNOSIS — K219 Gastro-esophageal reflux disease without esophagitis: Secondary | ICD-10-CM | POA: Diagnosis present

## 2013-07-08 DIAGNOSIS — F3289 Other specified depressive episodes: Secondary | ICD-10-CM | POA: Diagnosis present

## 2013-07-08 DIAGNOSIS — I10 Essential (primary) hypertension: Secondary | ICD-10-CM | POA: Diagnosis present

## 2013-07-08 DIAGNOSIS — Z87891 Personal history of nicotine dependence: Secondary | ICD-10-CM

## 2013-07-08 DIAGNOSIS — M25311 Other instability, right shoulder: Secondary | ICD-10-CM

## 2013-07-08 DIAGNOSIS — M19019 Primary osteoarthritis, unspecified shoulder: Principal | ICD-10-CM | POA: Diagnosis present

## 2013-07-08 DIAGNOSIS — F329 Major depressive disorder, single episode, unspecified: Secondary | ICD-10-CM | POA: Diagnosis present

## 2013-07-08 DIAGNOSIS — M810 Age-related osteoporosis without current pathological fracture: Secondary | ICD-10-CM | POA: Diagnosis present

## 2013-07-08 DIAGNOSIS — Z96659 Presence of unspecified artificial knee joint: Secondary | ICD-10-CM

## 2013-07-08 HISTORY — PX: REVERSE SHOULDER ARTHROPLASTY: SHX5054

## 2013-07-08 SURGERY — ARTHROPLASTY, SHOULDER, TOTAL, REVERSE
Anesthesia: Regional | Site: Shoulder | Laterality: Right | Wound class: Clean

## 2013-07-08 MED ORDER — MIDAZOLAM HCL 2 MG/2ML IJ SOLN
1.0000 mg | INTRAMUSCULAR | Status: DC | PRN
  Administered 2013-07-08: 1 mg via INTRAVENOUS
  Filled 2013-07-08: qty 2

## 2013-07-08 MED ORDER — LIDOCAINE HCL (CARDIAC) 20 MG/ML IV SOLN
INTRAVENOUS | Status: DC | PRN
  Administered 2013-07-08: 40 mg via INTRAVENOUS

## 2013-07-08 MED ORDER — ROCURONIUM BROMIDE 100 MG/10ML IV SOLN
INTRAVENOUS | Status: DC | PRN
  Administered 2013-07-08: 30 mg via INTRAVENOUS

## 2013-07-08 MED ORDER — GLYCOPYRROLATE 0.2 MG/ML IJ SOLN
INTRAMUSCULAR | Status: DC | PRN
  Administered 2013-07-08: 0.4 mg via INTRAVENOUS

## 2013-07-08 MED ORDER — CHLORHEXIDINE GLUCONATE 4 % EX LIQD: 60.0000 mL | Freq: Once | CUTANEOUS | Status: DC

## 2013-07-08 MED ORDER — ONDANSETRON HCL 4 MG PO TABS: 4.0000 mg | ORAL_TABLET | Freq: Four times a day (QID) | ORAL | Status: DC | PRN

## 2013-07-08 MED ORDER — FENTANYL CITRATE 0.05 MG/ML IJ SOLN
INTRAMUSCULAR | Status: DC | PRN
  Administered 2013-07-08: 50 ug via INTRAVENOUS

## 2013-07-08 MED ORDER — OXYCODONE-ACETAMINOPHEN 5-325 MG PO TABS: 1.0000 | ORAL_TABLET | ORAL | Status: DC | PRN

## 2013-07-08 MED ORDER — ONDANSETRON HCL 4 MG/2ML IJ SOLN: 4.0000 mg | Freq: Four times a day (QID) | INTRAMUSCULAR | Status: DC | PRN

## 2013-07-08 MED ORDER — SODIUM CHLORIDE 0.9 % IR SOLN
Status: DC | PRN
  Administered 2013-07-08: 1000 mL

## 2013-07-08 MED ORDER — ACETAMINOPHEN 325 MG PO TABS: 650.0000 mg | ORAL_TABLET | Freq: Four times a day (QID) | ORAL | Status: DC | PRN

## 2013-07-08 MED ORDER — NEOSTIGMINE METHYLSULFATE 1 MG/ML IJ SOLN
INTRAMUSCULAR | Status: DC | PRN
  Administered 2013-07-08: 3 mg via INTRAVENOUS

## 2013-07-08 MED ORDER — VENLAFAXINE HCL ER 150 MG PO CP24
300.0000 mg | ORAL_CAPSULE | Freq: Every day | ORAL | Status: DC
  Administered 2013-07-09 – 2013-07-12 (×4): 300 mg via ORAL
  Filled 2013-07-08 (×5): qty 2

## 2013-07-08 MED ORDER — CEFAZOLIN SODIUM-DEXTROSE 2-3 GM-% IV SOLR
2.0000 g | Freq: Four times a day (QID) | INTRAVENOUS | Status: AC
  Administered 2013-07-08 – 2013-07-09 (×3): 2 g via INTRAVENOUS
  Filled 2013-07-08 (×3): qty 50

## 2013-07-08 MED ORDER — OXYCODONE HCL 5 MG/5ML PO SOLN: 5.0000 mg | Freq: Once | ORAL | Status: DC | PRN

## 2013-07-08 MED ORDER — MENTHOL 3 MG MT LOZG: 1.0000 | LOZENGE | OROMUCOSAL | Status: DC | PRN

## 2013-07-08 MED ORDER — OXYCODONE-ACETAMINOPHEN 5-325 MG PO TABS
1.0000 | ORAL_TABLET | Freq: Three times a day (TID) | ORAL | Status: DC | PRN
  Administered 2013-07-10 (×2): 1 via ORAL
  Filled 2013-07-08 (×2): qty 1

## 2013-07-08 MED ORDER — PROPOFOL 10 MG/ML IV BOLUS
INTRAVENOUS | Status: DC | PRN
  Administered 2013-07-08: 160 mg via INTRAVENOUS

## 2013-07-08 MED ORDER — CLONAZEPAM 1 MG PO TABS
1.0000 mg | ORAL_TABLET | Freq: Every day | ORAL | Status: DC
  Administered 2013-07-09 – 2013-07-12 (×4): 1 mg via ORAL
  Filled 2013-07-08 (×4): qty 1

## 2013-07-08 MED ORDER — METOCLOPRAMIDE HCL 10 MG PO TABS: 5.0000 mg | ORAL_TABLET | Freq: Three times a day (TID) | ORAL | Status: DC | PRN

## 2013-07-08 MED ORDER — BUPIVACAINE-EPINEPHRINE 0.25% -1:200000 IJ SOLN
INTRAMUSCULAR | Status: DC | PRN
  Administered 2013-07-08: 10 mL

## 2013-07-08 MED ORDER — LISINOPRIL 20 MG PO TABS
20.0000 mg | ORAL_TABLET | Freq: Every day | ORAL | Status: DC
  Administered 2013-07-10: 20 mg via ORAL
  Filled 2013-07-08 (×4): qty 1

## 2013-07-08 MED ORDER — MORPHINE SULFATE 2 MG/ML IJ SOLN
2.0000 mg | INTRAMUSCULAR | Status: DC | PRN
  Administered 2013-07-08 – 2013-07-09 (×2): 2 mg via INTRAVENOUS
  Filled 2013-07-08 (×2): qty 1

## 2013-07-08 MED ORDER — OXYCODONE HCL 5 MG PO TABS: 5.0000 mg | ORAL_TABLET | Freq: Once | ORAL | Status: DC | PRN

## 2013-07-08 MED ORDER — ONDANSETRON HCL 4 MG/2ML IJ SOLN
INTRAMUSCULAR | Status: DC | PRN
  Administered 2013-07-08: 4 mg via INTRAVENOUS

## 2013-07-08 MED ORDER — LISINOPRIL-HYDROCHLOROTHIAZIDE 20-25 MG PO TABS: 1.0000 | ORAL_TABLET | Freq: Every day | ORAL | Status: DC

## 2013-07-08 MED ORDER — ACETAMINOPHEN 650 MG RE SUPP: 650.0000 mg | Freq: Four times a day (QID) | RECTAL | Status: DC | PRN

## 2013-07-08 MED ORDER — SODIUM CHLORIDE 0.9 % IV SOLN
INTRAVENOUS | Status: DC
  Administered 2013-07-08: 19:00:00 via INTRAVENOUS

## 2013-07-08 MED ORDER — OXYCODONE HCL 5 MG PO TABS
5.0000 mg | ORAL_TABLET | ORAL | Status: DC | PRN
  Administered 2013-07-08 – 2013-07-12 (×17): 10 mg via ORAL
  Filled 2013-07-08 (×17): qty 2

## 2013-07-08 MED ORDER — METHOCARBAMOL 500 MG PO TABS: 500.0000 mg | ORAL_TABLET | Freq: Three times a day (TID) | ORAL | Status: DC | PRN

## 2013-07-08 MED ORDER — SODIUM CHLORIDE 0.9 % IV SOLN
10.0000 mg | INTRAVENOUS | Status: DC | PRN
  Administered 2013-07-08: 10 ug/min via INTRAVENOUS

## 2013-07-08 MED ORDER — METHOCARBAMOL 100 MG/ML IJ SOLN
500.0000 mg | Freq: Four times a day (QID) | INTRAVENOUS | Status: DC | PRN
  Filled 2013-07-08: qty 5

## 2013-07-08 MED ORDER — PHENOL 1.4 % MT LIQD: 1.0000 | OROMUCOSAL | Status: DC | PRN

## 2013-07-08 MED ORDER — FENTANYL CITRATE 0.05 MG/ML IJ SOLN
50.0000 ug | INTRAMUSCULAR | Status: DC | PRN
  Administered 2013-07-08: 50 ug via INTRAVENOUS
  Filled 2013-07-08: qty 2

## 2013-07-08 MED ORDER — ESTRADIOL 1 MG PO TABS
1.0000 mg | ORAL_TABLET | Freq: Every day | ORAL | Status: DC
  Administered 2013-07-08 – 2013-07-12 (×5): 1 mg via ORAL
  Filled 2013-07-08 (×5): qty 1

## 2013-07-08 MED ORDER — METHOCARBAMOL 500 MG PO TABS
500.0000 mg | ORAL_TABLET | Freq: Four times a day (QID) | ORAL | Status: DC | PRN
  Administered 2013-07-09 – 2013-07-11 (×4): 500 mg via ORAL
  Filled 2013-07-08 (×4): qty 1

## 2013-07-08 MED ORDER — HYDROMORPHONE HCL PF 1 MG/ML IJ SOLN: 0.2500 mg | INTRAMUSCULAR | Status: DC | PRN

## 2013-07-08 MED ORDER — BISACODYL 10 MG RE SUPP: 10.0000 mg | Freq: Every day | RECTAL | Status: DC | PRN

## 2013-07-08 MED ORDER — LACTATED RINGERS IV SOLN
INTRAVENOUS | Status: DC
  Administered 2013-07-08 (×2): via INTRAVENOUS

## 2013-07-08 MED ORDER — ONDANSETRON HCL 4 MG/2ML IJ SOLN
4.0000 mg | Freq: Four times a day (QID) | INTRAMUSCULAR | Status: DC | PRN
  Administered 2013-07-09 (×2): 4 mg via INTRAVENOUS
  Filled 2013-07-08 (×2): qty 2

## 2013-07-08 MED ORDER — HYDROXYZINE HCL 25 MG PO TABS
50.0000 mg | ORAL_TABLET | Freq: Three times a day (TID) | ORAL | Status: DC | PRN
  Administered 2013-07-09 – 2013-07-10 (×3): 50 mg via ORAL
  Filled 2013-07-08 (×3): qty 2

## 2013-07-08 MED ORDER — VITAMIN D (ERGOCALCIFEROL) 1.25 MG (50000 UNIT) PO CAPS
50000.0000 [IU] | ORAL_CAPSULE | ORAL | Status: DC
  Administered 2013-07-11: 50000 [IU] via ORAL
  Filled 2013-07-08: qty 1

## 2013-07-08 MED ORDER — HYDROCHLOROTHIAZIDE 25 MG PO TABS
25.0000 mg | ORAL_TABLET | Freq: Every day | ORAL | Status: DC
  Administered 2013-07-10: 25 mg via ORAL
  Filled 2013-07-08 (×4): qty 1

## 2013-07-08 MED ORDER — METOCLOPRAMIDE HCL 5 MG/ML IJ SOLN: 5.0000 mg | Freq: Three times a day (TID) | INTRAMUSCULAR | Status: DC | PRN

## 2013-07-08 SURGICAL SUPPLY — 60 items
BIT DRILL 170X2.5X (BIT) IMPLANT
BIT DRL 170X2.5X (BIT)
BLADE SAG 18X100X1.27 (BLADE) ×2 IMPLANT
CAPT SHOULD DELTAXTEND CEM MOD ×2 IMPLANT
CLOTH BEACON ORANGE TIMEOUT ST (SAFETY) ×2 IMPLANT
CLSR STERI-STRIP ANTIMIC 1/2X4 (GAUZE/BANDAGES/DRESSINGS) ×2 IMPLANT
COVER SURGICAL LIGHT HANDLE (MISCELLANEOUS) ×2 IMPLANT
DRAPE INCISE IOBAN 66X45 STRL (DRAPES) ×6 IMPLANT
DRAPE U-SHAPE 47X51 STRL (DRAPES) ×2 IMPLANT
DRAPE X-RAY CASS 24X20 (DRAPES) IMPLANT
DRILL 2.5 (BIT)
DRSG ADAPTIC 3X8 NADH LF (GAUZE/BANDAGES/DRESSINGS) ×2 IMPLANT
DRSG PAD ABDOMINAL 8X10 ST (GAUZE/BANDAGES/DRESSINGS) ×2 IMPLANT
DURAPREP 26ML APPLICATOR (WOUND CARE) ×2 IMPLANT
ELECT BLADE 4.0 EZ CLEAN MEGAD (MISCELLANEOUS) ×2
ELECT NEEDLE TIP 2.8 STRL (NEEDLE) ×2 IMPLANT
ELECT REM PT RETURN 9FT ADLT (ELECTROSURGICAL) ×2
ELECTRODE BLDE 4.0 EZ CLN MEGD (MISCELLANEOUS) ×1 IMPLANT
ELECTRODE REM PT RTRN 9FT ADLT (ELECTROSURGICAL) ×1 IMPLANT
GLOVE BIOGEL PI ORTHO PRO 7.5 (GLOVE) ×1
GLOVE BIOGEL PI ORTHO PRO SZ8 (GLOVE) ×1
GLOVE ORTHO TXT STRL SZ7.5 (GLOVE) ×2 IMPLANT
GLOVE PI ORTHO PRO STRL 7.5 (GLOVE) ×1 IMPLANT
GLOVE PI ORTHO PRO STRL SZ8 (GLOVE) ×1 IMPLANT
GLOVE SURG ORTHO 8.5 STRL (GLOVE) ×2 IMPLANT
GOWN STRL NON-REIN LRG LVL3 (GOWN DISPOSABLE) ×2 IMPLANT
GOWN STRL REIN XL XLG (GOWN DISPOSABLE) ×4 IMPLANT
HANDPIECE INTERPULSE COAX TIP (DISPOSABLE)
KIT BASIN OR (CUSTOM PROCEDURE TRAY) ×2 IMPLANT
KIT ROOM TURNOVER OR (KITS) ×2 IMPLANT
MANIFOLD NEPTUNE II (INSTRUMENTS) ×2 IMPLANT
NEEDLE 1/2 CIR MAYO (NEEDLE) ×2 IMPLANT
NEEDLE HYPO 25GX1X1/2 BEV (NEEDLE) ×2 IMPLANT
NS IRRIG 1000ML POUR BTL (IV SOLUTION) ×2 IMPLANT
PACK SHOULDER (CUSTOM PROCEDURE TRAY) ×2 IMPLANT
PAD ARMBOARD 7.5X6 YLW CONV (MISCELLANEOUS) ×4 IMPLANT
PIN GUIDE 1.2 (PIN) IMPLANT
PIN GUIDE GLENOPHERE 1.5MX300M (PIN) IMPLANT
PIN METAGLENE 2.5 (PIN) IMPLANT
SET HNDPC FAN SPRY TIP SCT (DISPOSABLE) IMPLANT
SLING ARM FOAM STRAP LRG (SOFTGOODS) IMPLANT
SLING ARM FOAM STRAP MED (SOFTGOODS) IMPLANT
SPONGE GAUZE 4X4 12PLY (GAUZE/BANDAGES/DRESSINGS) ×2 IMPLANT
SPONGE LAP 18X18 X RAY DECT (DISPOSABLE) ×2 IMPLANT
SPONGE LAP 4X18 X RAY DECT (DISPOSABLE) ×2 IMPLANT
STRIP CLOSURE SKIN 1/2X4 (GAUZE/BANDAGES/DRESSINGS) ×2 IMPLANT
SUCTION FRAZIER TIP 10 FR DISP (SUCTIONS) ×2 IMPLANT
SUT FIBERWIRE #2 38 T-5 BLUE (SUTURE) ×4
SUT MNCRL AB 4-0 PS2 18 (SUTURE) ×2 IMPLANT
SUT VIC AB 2-0 CT1 27 (SUTURE) ×2
SUT VIC AB 2-0 CT1 TAPERPNT 27 (SUTURE) ×1 IMPLANT
SUT VICRYL 0 CT 1 36IN (SUTURE) ×2 IMPLANT
SUTURE FIBERWR #2 38 T-5 BLUE (SUTURE) ×2 IMPLANT
SYR CONTROL 10ML LL (SYRINGE) ×2 IMPLANT
TOWEL OR 17X24 6PK STRL BLUE (TOWEL DISPOSABLE) ×2 IMPLANT
TOWEL OR 17X26 10 PK STRL BLUE (TOWEL DISPOSABLE) ×2 IMPLANT
TOWER CARTRIDGE SMART MIX (DISPOSABLE) IMPLANT
TRAY FOLEY CATH 16FRSI W/METER (SET/KITS/TRAYS/PACK) ×2 IMPLANT
WATER STERILE IRR 1000ML POUR (IV SOLUTION) ×2 IMPLANT
YANKAUER SUCT BULB TIP NO VENT (SUCTIONS) IMPLANT

## 2013-07-08 NOTE — Anesthesia Preprocedure Evaluation (Addendum)
Anesthesia Evaluation  Patient identified by MRN, date of birth, ID band Patient awake    Reviewed: Allergy & Precautions, H&P , NPO status , Patient's Chart, lab work & pertinent test results  Airway Mallampati: II  Neck ROM: full    Dental   Pulmonary former smoker,          Cardiovascular hypertension,     Neuro/Psych  Headaches, Depression    GI/Hepatic GERD-  ,  Endo/Other  obese  Renal/GU      Musculoskeletal  (+) Arthritis -,   Abdominal   Peds  Hematology   Anesthesia Other Findings   Reproductive/Obstetrics                          Anesthesia Physical Anesthesia Plan  ASA: III  Anesthesia Plan: General and Regional   Post-op Pain Management: MAC Combined w/ Regional for Post-op pain   Induction: Intravenous  Airway Management Planned: Oral ETT  Additional Equipment:   Intra-op Plan:   Post-operative Plan: Extubation in OR  Informed Consent: I have reviewed the patients History and Physical, chart, labs and discussed the procedure including the risks, benefits and alternatives for the proposed anesthesia with the patient or authorized representative who has indicated his/her understanding and acceptance.     Plan Discussed with: CRNA, Anesthesiologist and Surgeon  Anesthesia Plan Comments:         Anesthesia Quick Evaluation

## 2013-07-08 NOTE — Interval H&P Note (Signed)
H&P documentation: see H+P  -History and Physical Reviewed  -Patient has been re-examined, no changes to her exam  -No change in the plan of care, Right reverse TSA  Maria George,STEVEN R

## 2013-07-08 NOTE — Anesthesia Procedure Notes (Addendum)
Procedure Name: Intubation Date/Time: 07/08/2013 1:48 PM Performed by: Sharlene Dory E Pre-anesthesia Checklist: Patient identified, Emergency Drugs available, Suction available, Patient being monitored and Timeout performed Patient Re-evaluated:Patient Re-evaluated prior to inductionOxygen Delivery Method: Circle system utilized Preoxygenation: Pre-oxygenation with 100% oxygen Intubation Type: IV induction Ventilation: Mask ventilation without difficulty Laryngoscope Size: Mac and 3 Grade View: Grade II Tube type: Oral Tube size: 7.0 mm Number of attempts: 1 Airway Equipment and Method: Stylet Placement Confirmation: ETT inserted through vocal cords under direct vision,  positive ETCO2 and breath sounds checked- equal and bilateral Secured at: 21 cm Tube secured with: Tape Dental Injury: Teeth and Oropharynx as per pre-operative assessment     Anesthesia Regional Block:   Narrative:    Anesthesia Regional Block:  Interscalene brachial plexus block  Pre-Anesthetic Checklist: ,, timeout performed, Correct Patient, Correct Site, Correct Laterality, Correct Procedure, Correct Position, site marked, Risks and benefits discussed,  Surgical consent,  Pre-op evaluation,  At surgeon's request and post-op pain management  Laterality: Right  Prep: chloraprep       Needles:  Injection technique: Single-shot  Needle Type: Echogenic Stimulator Needle     Needle Length:cm 9 cm Needle Gauge: 22 and 22 G    Additional Needles:  Procedures: nerve stimulator Interscalene brachial plexus block Narrative:  Start time: 08/17/2013 12:30 PM End time: 08/17/2013 12:35 PM Injection made incrementally with aspirations every 5 mL.  Performed by: Personally   Additional Notes: 25 cc 0.5% Marcaine 1:200 Epi injected without difficulty

## 2013-07-08 NOTE — Anesthesia Postprocedure Evaluation (Signed)
  Anesthesia Post-op Note  Patient: Maria George  Procedure(s) Performed: Procedure(s): REVERSE RIGHT SHOULDER ARTHROPLASTY (Right)  Patient Location: PACU  Anesthesia Type:General and GA combined with regional for post-op pain  Level of Consciousness: awake, alert  and oriented  Airway and Oxygen Therapy: Patient Spontanous Breathing and Patient connected to nasal cannula oxygen  Post-op Pain: none  Post-op Assessment: Post-op Vital signs reviewed, Patient's Cardiovascular Status Stable, Respiratory Function Stable, Patent Airway and Pain level controlled  Post-op Vital Signs: stable  Complications: No apparent anesthesia complications

## 2013-07-08 NOTE — Brief Op Note (Signed)
07/08/2013  4:24 PM  PATIENT:  Maria George  72 y.o. female  PRE-OPERATIVE DIAGNOSIS:  Right Shoulder Rotator Cuff Arthropathy  POST-OPERATIVE DIAGNOSIS:  right shoulder rotator cuff arthropathy  PROCEDURE:  Procedure(s): REVERSE RIGHT SHOULDER ARTHROPLASTY (Right), DePuy Delta Xtend  SURGEON:  Surgeon(s) and Role:    * [name], MD - Primary  PHYSICIAN ASSISTANT:   ASSISTANTS: Thea Gist, PA-C   ANESTHESIA:   regional and general  EBL:  Total I/O In: 1000 [I.V.:1000] Out: 250 [Urine:150; Blood:100]  BLOOD ADMINISTERED:none  DRAINS: none   LOCAL MEDICATIONS USED:  MARCAINE     SPECIMEN:  No Specimen  DISPOSITION OF SPECIMEN:  N/A  COUNTS:  YES  TOURNIQUET:  * No tourniquets in log *  DICTATION: .Other Dictation: Dictation Number [id number]  PLAN OF CARE: Admit to inpatient   PATIENT DISPOSITION:  PACU - hemodynamically stable.   Delay start of Pharmacological VTE agent (>24hrs) due to surgical blood loss or risk of bleeding: yes

## 2013-07-08 NOTE — Preoperative (Signed)
Beta Blockers   Reason not to administer Beta Blockers:Not Applicable 

## 2013-07-08 NOTE — Transfer of Care (Signed)
Immediate Anesthesia Transfer of Care Note  Patient: Maria George  Procedure(s) Performed: Procedure(s): REVERSE RIGHT SHOULDER ARTHROPLASTY (Right)  Patient Location: PACU  Anesthesia Type:General  Level of Consciousness: awake, alert  and oriented  Airway & Oxygen Therapy: Patient Spontanous Breathing and Patient connected to nasal cannula oxygen  Post-op Assessment: Report given to PACU RN, Post -op Vital signs reviewed and stable and Patient moving all extremities X 4  Post vital signs: Reviewed and stable  Complications: No apparent anesthesia complications

## 2013-07-09 LAB — BASIC METABOLIC PANEL
Chloride: 100 mEq/L (ref 96–112)
GFR calc Af Amer: 54 mL/min — ABNORMAL LOW (ref >90)
GFR calc non Af Amer: 46 mL/min — ABNORMAL LOW (ref >90)
Potassium: 3.7 mEq/L (ref 3.5–5.1)
Sodium: 135 mEq/L (ref 135–145)

## 2013-07-09 LAB — URINE MICROSCOPIC-ADD ON

## 2013-07-09 LAB — HEMOGLOBIN AND HEMATOCRIT, BLOOD: Hemoglobin: 11.2 g/dL — ABNORMAL LOW (ref 12.0–15.0)

## 2013-07-09 LAB — URINALYSIS, ROUTINE W REFLEX MICROSCOPIC
Bilirubin Urine: NEGATIVE
Glucose, UA: 100 mg/dL — AB
Hgb urine dipstick: NEGATIVE
Ketones, ur: NEGATIVE mg/dL
Nitrite: NEGATIVE
Specific Gravity, Urine: 1.021 (ref 1.005–1.030)
pH: 6 (ref 5.0–8.0)

## 2013-07-09 NOTE — Op Note (Signed)
NAMETASNIA, SPEGAL             ACCOUNT NO.:  [id number]  MEDICAL RECORD NO.:  [id number]  LOCATION:  5N15C                        FACILITY:  [facility]  PHYSICIAN:  Almedia Balls. Ranell Patrick, M.D. 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  PROCEDURE PERFORMED:  Right shoulder reverse total shoulder arthroplasty.  ATTENDING SURGEON:  Almedia Balls. Ranell Patrick, M.D.  ASSISTANT:  Donnie Coffin. Dixon, PA-C, who was scrubbed during the entire procedure and necessary for satisfactory completion of surgery.  ANESTHESIA:  General anesthesia was used plus interscalene block.  ESTIMATED BLOOD LOSS:  150 mL.  FLUID REPLACEMENT:  1500 mL of crystalloid.  INSTRUMENT COUNTS:  Correct.  COMPLICATIONS:  There were no complications.  ANTIBIOTICS:  Perioperative antibiotics were given.  INDICATIONS:  The patient is a 72 year old female with worsening right shoulder pain secondary to rotator cuff insufficiency and rotator cuff tear arthropathy.  The patient has had multiple prior rotator cuff surgeries in the shoulder and presents with terrible shoulder function and refractory pain to conservative management.  She desires reverse total shoulder arthroplasty to restore function, eliminate pain to her shoulder.  Informed consent obtained.  DESCRIPTION OF PROCEDURE:  After an adequate level of anesthesia was achieved, the patient was positioned in the modified beach-chair position.  Right shoulder correctly identified, sterilely prepped, and draped in the usual manner.  Time-out was called.  The right shoulder was extremely stiff with minimal range of motion even passively with her asleep with significant grinding and crepitus.  After  time-out, we entered the shoulder using standard anterior deltopectoral incision.  We started at the coracoid process extending down to the anterior humerus. Dissection down through subcutaneous tissues using the Bovie.  We identified the cephalic vein, took it laterally to the deltoid.  The pectoralis was taken medially.  The conjoined tendon was identified and retracted medially.  The subscapularis was taken off the lesser tuberosity and tagged for repair at the end.  The biceps tendon was not present.  The rotator cuff was torn.  Suture material was remaining inside the shoulder, we removed remaining rotator cuff tissue, there was basically scar tissue.  We released the capsule off the inferior neck of the humerus protecting the axillary nerve.  We were able to gain a sufficient exposure to deliver the humeral head out of the wound.  We then entered the proximal humerus with a 6-mm hand reamer and then we reamed up to a size 10 diameter reamer and then we went ahead and performed our metaphyseal preparation for size 1 right prosthesis.  We did our reaming for that and introduced our trial implant, 10 stem size 1 right metaphysis.  We impacted that into position with 10 degrees of retroversion.  We then subluxed the humerus posteriorly and did experience a 360-degree capsular release.  There was severe scarring and thickening of the capsule making this extremely difficult, this took [place] about 40 minutes to remove all of the capsule being careful not to injure the axillary  nerve just to gain sufficient exposure to be able to prepare the glenoid.  First found the center point for glenoid, drilled our guide pin, and then our did our glenoid reaming for the metaglene. We then went ahead and did our hand reaming peripherally and then went ahead and drilled over the top of the guide pin to drill out the center PEG for metaglene.  We impacted the metaglene into position, taking care to maintain  the orientation between the 12 o'clock and 6 o'clock positions and we referenced off the inferior scapular neck and body.  At this point, we irrigated thoroughly.  We then went ahead and placed our locked screws, 36 inferiorly, 36 up into the base of the coracoid, and 18 nonlocked posteriorly.  Great purchase with all screws.  Anteriorly, there was not enough room to place a screw.  We locked our screws in position.  We then placed a 38 standard glenosphere into position, tightened that into place, impacting and tightening until it was secure, checked to make sure that would not come off.  We thoroughly irrigated, checked our axillary nerve to make sure it is free and clear, and then reduced with a 38+ 3 trial.  This was quite tight, but it was stable. We ranged her shoulder fully, no instability noted.  We removed the trial components, we placed drill holes for repair of this subscapularis, and we had #2 FiberWire suture through this drill holes x2.  We then went ahead and using impaction grafting technique utilized our Delta Xtend prosthesis hydroxyapatite coated press-fit stem size 10 for the stem and then size 1 right for the metaphysis, which was screwed in position and impacted in 10 degrees of retroversion with that bone grafting.  We had nice seating of the implant, no instability at all. We then went ahead and repaired our subscapularis back to the lesser tuberosity.  It did not excessively limit external rotation, maybe 40 degrees to 45 degrees of external rotation, and it did not significantly limit forward elevation.  We had nice stable shoulder conjoined under tension.  The axillary nerve was under a little bit tension, but not seem excessive to me.  No gapping with external rotation.  Negative sulcus.  We thoroughly irrigated and closed the deltopectoral interval with 0-Vicryl suture followed by 2-0 Vicryl subcutaneous closure and 4-0 running Monocryl for skin.  Steri-Strips  and sterile compressive bandage were applied and a shoulder sling.  The patient was taken to the recovery room in stable condition.     Almedia Balls. Ranell Patrick, M.D.     SRN/MEDQ  D:  07/08/2013  T:  07/09/2013  Job:  [id number]

## 2013-07-09 NOTE — Evaluation (Signed)
Occupational Therapy Evaluation Patient Details Name: Maria George MRN: [id number] DOB: 03-May-1941 Today's Date: 07/09/2013 Time: 4098-1191 OT Time Calculation (min): 52 min  OT Assessment / Plan / Recommendation History of present illness right reverse TSA   Clinical Impression   This 72 yo female admitted and underwent above presents to acute OT with deficits below. Pain is a big limiting factor today. Will benefit from acute OT with follow up per MD.    OT Assessment  Patient needs continued OT Services    Follow Up Recommendations   (follow up per MD)    Barriers to Discharge Decreased caregiver support (did recommend that family stay with her 24/7 for a few days)    Equipment Recommendations  None recommended by OT       Frequency  Min 3X/week    Precautions / Restrictions Precautions Precautions: Shoulder Shoulder Interventions: Shoulder sling/immobilizer;For comfort Required Braces or Orthoses: Sling Restrictions Weight Bearing Restrictions: Yes RUE Weight Bearing: Non weight bearing   Pertinent Vitals/Pain 10/10 pre-medicated with all she could have    ADL  Toilet Transfer: Minimal assistance Toilet Transfer Method: Sit to stand Toilet Transfer Equipment:  (recliner around to bed) Equipment Used:  (sling) Transfers/Ambulation Related to ADLs: min A ADL Comments: Currently pt is total A for all BADLs due to pain    OT Diagnosis: Generalized weakness;Acute pain  OT Problem List: Decreased strength;Decreased range of motion;Decreased activity tolerance;Pain;Impaired balance (sitting and/or standing);Obesity;Decreased knowledge of precautions;Impaired UE functional use OT Treatment Interventions: Self-care/ADL training;Balance training;DME and/or AE instruction;Patient/family education;Therapeutic exercise   OT Goals(Current goals can be found in the care plan section) Acute Rehab OT Goals OT Goal Formulation: With patient Time For Goal Achievement:  07/11/13 Potential to Achieve Goals: Good  Visit Information  Last OT Received On: 07/09/13 Assistance Needed: +1 History of Present Illness: right reverse TSA       Prior Functioning     Home Living Family/patient expects to be discharged to:: Private residence Living Arrangements: Alone Available Help at Discharge: Family;Available PRN/intermittently Type of Home: House Home Access: Stairs to enter [employer] of Steps: 3 Entrance Stairs-Rails: Left Home Layout: One level Home Equipment: Shower seat - built in Prior Function Level of Independence: Independent Communication Communication: No difficulties Dominant Hand: Right         Vision/Perception Vision - History Patient Visual Report: No change from baseline   Cognition  Cognition Arousal/Alertness: Awake/alert Behavior During Therapy: WFL for tasks assessed/performed Overall Cognitive Status: Within Functional Limits for tasks assessed    Extremity/Trunk Assessment Upper Extremity Assessment Upper Extremity Assessment: RUE deficits/detail RUE Deficits / Details: reverse TSA this admission RUE Coordination: decreased gross motor;decreased fine motor     Mobility Bed Mobility Bed Mobility: Sit to Sidelying Left Sit to Sidelying Left: 3: Mod assist;HOB flat Details for Bed Mobility Assistance: VCs for sequencing Transfers Transfers: Sit to Stand;Stand to Sit Sit to Stand: 4: Min assist;With upper extremity assist;With armrests;From chair/3-in-1 (LUE) Stand to Sit: 4: Min assist;With upper extremity assist;To chair/3-in-1 (LUE) Details for Transfer Assistance: VCs for safe hand placement     Exercise Other Exercises Other Exercises: Pt did 10 reps of Dr. Ranell Patrick' reverse TSA protocol in bed for shoulder flexion (only about 20 degrees with A from daughter) and shoulder external rotation to -20 degrees from neutral. Method for sponge bathing under operated UE: Caregiver independent with  task Donning/doffing sling/immobilizer: Caregiver independent with task Correct positioning of sling/immobilizer: Caregiver independent with task ROM for elbow, wrist  and digits of operated UE: Min-guard Sling wearing schedule (on at all times/off for ADL's): Caregiver independent with task Dressing change:  (na) Positioning of UE while sleeping: Supervision/safety      End of Session OT - End of Session Equipment Utilized During Treatment:  (sling) Activity Tolerance: Patient limited by pain Patient left: in bed;with call bell/phone within reach;with family/visitor present       Evette Georges [phone] 07/09/2013, 1:04 PM

## 2013-07-09 NOTE — Progress Notes (Signed)
   Subjective: 1 Day Post-Op Procedure(s) (LRB): REVERSE RIGHT SHOULDER ARTHROPLASTY (Right)   Patient reports pain as moderate, controlled with medication. Unable to sleep throughout the night, otherwise no events.  Objective:   VITALS:   Filed Vitals:   07/09/13 0600  BP: 101/58  Pulse: 86  Temp: 97.6 F (36.4 C)  Resp: 16    Neurovascular intact Dorsiflexion/Plantar flexion intact Incision: dressing C/D/I No cellulitis present Compartment soft  LABS  Recent Labs  07/09/13 0415  HGB 11.2*  HCT 33.0*     Recent Labs  07/09/13 0415  NA 135  K 3.7  BUN 18  CREATININE 1.15*  GLUCOSE 113*     Assessment/Plan: 1 Day Post-Op Procedure(s) (LRB): REVERSE RIGHT SHOULDER ARTHROPLASTY (Right) Advance diet Up with therapy D/C IV fluids Discharge home with home health eventually, when ready     Anastasio Auerbach. Chaya Dehaan   PAC  07/09/2013, 8:22 AM

## 2013-07-10 NOTE — Progress Notes (Signed)
   Subjective: 2 Days Post-Op Procedure(s) (LRB): REVERSE RIGHT SHOULDER ARTHROPLASTY (Right) Patient reports pain as mild.   Patient seen in rounds for Dr. Ranell Patrick. Patient is well, but has had some minor complaints of pain in the shoulder, requiring pain medications Patient is ready to go home later today.  Objective: Vital signs in last 24 hours: Temp:  [97.8 F (36.6 C)-97.9 F (36.6 C)] 97.9 F (36.6 C) (11/23 0505) Pulse Rate:  [97-106] 97 (11/23 0505) Resp:  [18] 18 (11/22 2156) BP: (106-141)/(53-66) 115/61 mmHg (11/23 1042) SpO2:  [90 %-91 %] 90 % (11/23 0505)  Intake/Output from previous day:  Intake/Output Summary (Last 24 hours) at 07/10/13 1315 Last data filed at 07/10/13 1036  Gross per 24 hour  Intake   2560 ml  Output      0 ml  Net   2560 ml    Intake/Output this shift: Total I/O In: 2320 [P.O.:240; I.V.:2080] Out: -   Labs:  Recent Labs  07/09/13 0415  HGB 11.2*    Recent Labs  07/09/13 0415  HCT 33.0*    Recent Labs  07/09/13 0415  NA 135  K 3.7  CL 100  CO2 26  BUN 18  CREATININE 1.15*  GLUCOSE 113*  CALCIUM 8.4   No results found for this basename: LABPT, INR,  in the last 72 hours  EXAM: General - Patient is Alert and Appropriate Extremity - Neurovascular intact Sensation intact distally Moving hand and fingers on exam Incision - clean, dry, no drainage   Assessment/Plan: 2 Days Post-Op Procedure(s) (LRB): REVERSE RIGHT SHOULDER ARTHROPLASTY (Right) Procedure(s) (LRB): REVERSE RIGHT SHOULDER ARTHROPLASTY (Right) Past Medical History  Diagnosis Date  . Hypertension   . Osteoporosis   . Depression   . Migraine     has been yrs.  . Arthritis   . GERD (gastroesophageal reflux disease)     otc   Active Problems:   * No active hospital problems. *  Estimated body mass index is 32.91 kg/(m^2) as calculated from the following:   Height as of 06/29/13: 5\' 2"  (1.575 m).   Weight as of 06/27/12: 81.647 kg (180  lb). Up with therapy Discharge home with home health Diet - Cardiac diet Follow up - in 2 weeks Activity - OT for ADL's prior to DC Disposition - Home Condition Upon Discharge - Good D/C Meds - See DC Summary  PERKINS, ALEXZANDREW 07/10/2013, 1:15 PM

## 2013-07-10 NOTE — Progress Notes (Signed)
Occupational Therapy Treatment Patient Details Name: Maria George MRN: [id number] DOB: Sep 21, 1940 Today's Date: 07/10/2013 Time: 7829-5621 OT Time Calculation (min): 57 min  OT Assessment / Plan / Recommendation  History of present illness right reverse TSA   OT comments  Pt unsteady with ambulation-requesting PT consult. Pt performed UE exercises on RUE today-pt in a lot of pain. Provided education to pt and family member. Feel pt would benefit for additional therapy prior to d/c home. Unsure if pt has 24/7 assist available.  Follow Up Recommendations  Home health OT;Supervision/Assistance - 24 hour    Barriers to Discharge       Equipment Recommendations  None recommended by OT    Recommendations for Other Services PT consult  Frequency Min 3X/week   Progress towards OT Goals Progress towards OT goals: Progressing toward goals  Plan Discharge plan needs to be updated    Precautions / Restrictions Precautions Precautions: Shoulder Shoulder Interventions: Shoulder sling/immobilizer;For comfort Precaution Booklet Issued: Yes (comment) Required Braces or Orthoses: Sling Restrictions Weight Bearing Restrictions: Yes RUE Weight Bearing: Non weight bearing   Pertinent Vitals/Pain Pain 9/10. Repositioned.     ADL  Lower Body Dressing: Moderate assistance (socks) Where Assessed - Lower Body Dressing:  (sitting on EOB) Toilet Transfer: Min Pension scheme manager Method: Sit to Barista: Other (comment) (from bed and chair) Equipment Used: Gait belt;Other (comment) (shoulder sling) Transfers/Ambulation Related to ADLs: Pt unsteady with ambulation. Min guard for transfers. ADL Comments: Provided education to grand daughter who was present during session. Pt performed exercises on RUE and pt in lots of pain. Reviewed ADL information on shoulder handout. Min A for balance during pendulum exercises- OT also provided assistance in moving pt's hips for  pendulums.    OT Diagnosis:    OT Problem List:   OT Treatment Interventions:     OT Goals(current goals can now be found in the care plan section) Acute Rehab OT Goals OT Goal Formulation: With patient Time For Goal Achievement: 07/11/13 Potential to Achieve Goals: Good ADL Goals Pt Will Perform Upper Body Dressing: with mod assist;standing Pt Will Perform Lower Body Dressing: with mod assist;sit to/from stand Pt Will Transfer to Toilet: with supervision;ambulating Pt Will Perform Toileting - Clothing Manipulation and hygiene: with min assist;sit to/from stand Pt/caregiver will Perform Home Exercise Program: With minimal assist;With written HEP provided;Increased ROM;Left upper extremity Additional ADL Goal #1: Family will be independent in A'ing pt prn with RUE exercises Additional ADL Goal #2: Pt's family will be independent in A'ing pt with BADLs prn  Visit Information  Last OT Received On: 07/10/13 Assistance Needed: +1 History of Present Illness: right reverse TSA    Subjective Data      Prior Functioning       Cognition  Cognition Arousal/Alertness: Awake/alert Behavior During Therapy: WFL for tasks assessed/performed Overall Cognitive Status: Impaired/Different from baseline Area of Impairment: Orientation Orientation Level: Disoriented to;Time    Mobility  Bed Mobility Bed Mobility: Sit to Sidelying Left;Rolling Right;Supine to Sit Rolling Right: 3: Mod assist Supine to Sit: 3: Mod assist Sit to Sidelying Left: 4: Min assist Details for Bed Mobility Assistance: Assist to move legs when rolling onto back. Assisted with trunk to bring to sitting position. Transfers Transfers: Sit to Stand;Stand to Sit Sit to Stand: 4: Min guard;With upper extremity assist;From bed;From chair/3-in-1 Stand to Sit: 4: Min guard;To chair/3-in-1;To bed Details for Transfer Assistance: Min guard for safety.    Exercises  Shoulder Exercises Pendulum Exercise:  AAROM;Right;10  reps;Standing Shoulder Flexion: AAROM;Right;15 reps;Supine Shoulder External Rotation: AAROM;Right;10 reps;Supine Elbow Flexion: AAROM;Right;5 reps;Standing Elbow Extension: AAROM;Right;5 reps;Standing Wrist Flexion: AROM;Right;10 reps;Seated Wrist Extension: AROM;Right;10 reps;Seated Digit Composite Flexion: AROM;Right;10 reps;Seated Composite Extension: AROM;Right;10 reps;Seated Donning/doffing shirt without moving shoulder:  (educated pt and family member) Method for sponge bathing under operated UE: Caregiver independent with task (grand daughter about to verbalize) Donning/doffing sling/immobilizer:  (educated and demonstrated how to do so) Correct positioning of sling/immobilizer:  (educated pt and family member) Pendulum exercises (written home exercise program): Moderate assistance ROM for elbow, wrist and digits of operated UE: Minimal assistance Sling wearing schedule (on at all times/off for ADL's): Patient able to independently direct caregiver Proper positioning of operated UE when showering:  (educated pt and family member) Positioning of UE while sleeping: Patient able to independently direct caregiver   Balance     End of Session OT - End of Session Equipment Utilized During Treatment: Gait belt Activity Tolerance: Patient limited by pain Patient left: in bed;with call bell/phone within reach;with family/visitor present Nurse Communication: Mobility status  GO     Earlie Raveling OTR/L [phone] 07/10/2013, 6:19 PM

## 2013-07-11 LAB — URINE MICROSCOPIC-ADD ON

## 2013-07-11 LAB — URINALYSIS, ROUTINE W REFLEX MICROSCOPIC
Glucose, UA: NEGATIVE mg/dL
Ketones, ur: NEGATIVE mg/dL
Nitrite: NEGATIVE
Protein, ur: NEGATIVE mg/dL
Specific Gravity, Urine: 1.02 (ref 1.005–1.030)
Urobilinogen, UA: 1 mg/dL (ref 0.0–1.0)
pH: 6 (ref 5.0–8.0)

## 2013-07-11 LAB — URINE CULTURE
Colony Count: NO GROWTH
Culture: NO GROWTH

## 2013-07-11 MED ORDER — CIPROFLOXACIN HCL 500 MG PO TABS
500.0000 mg | ORAL_TABLET | Freq: Two times a day (BID) | ORAL | Status: DC
  Administered 2013-07-11 – 2013-07-12 (×2): 500 mg via ORAL
  Filled 2013-07-11 (×4): qty 1

## 2013-07-11 NOTE — Clinical Social Work Psychosocial (Signed)
Clinical Social Work Department  BRIEF PSYCHOSOCIAL ASSESSMENT  Patient: Maria George  Account Number: [id number]  Admit date: 07/08/13 Clinical Social Worker Sabino Niemann, MSW Date/Time: 07/11/2013 3:02 PM Referred by: Physician Date Referred:  Referred for   SNF Placement   Other Referral:  Interview type: Patient  Other interview type: PSYCHOSOCIAL DATA  Living Status: Alone Admitted from facility:  Level of care:  Primary support name: Rodrigez,Rebekah Primary support relationship to patient: Daughter Degree of support available:  Strong and vested  CURRENT CONCERNS  Current Concerns   Post-Acute Placement   Other Concerns:  SOCIAL WORK ASSESSMENT / PLAN  CSW met with pt re: OT recommendation for SNF.   Pt lives alone  CSW explained placement process and answered questions.   Pt reports [facility]  as her preference    CSW completed FL2 and initiated SNF search.     Assessment/plan status: Information/Referral to [facility]  Other assessment/ plan:  Information/referral to community resources:  SNF   PTAR  PATIENT'S/FAMILY'S RESPONSE TO PLAN OF CARE:  Pt  reports she is agreeable to [facility] in order to increase strength and independence with mobility prior to returning home  Pt verbalized understanding of placement process and appreciation for CSW assist.   Sabino Niemann, MSW, LCSWA [phone]

## 2013-07-11 NOTE — Evaluation (Signed)
Physical Therapy Evaluation Patient Details Name: Maria George MRN: [id number] DOB: 01-05-1941 Today's Date: 07/11/2013 Time: 3086-5784 PT Time Calculation (min): 38 min  PT Assessment / Plan / Recommendation History of Present Illness  right reverse TSA  Clinical Impression  Patient is s/p above surgery resulting in functional limitations due to the deficits listed below (see PT Problem List). Pt was having balance issues prior to surgery and had recently completed OPPT for this. Pt does not have 24/7 assist at home (lives alone). Patient will benefit from skilled PT to increase their independence and safety with mobility to allow discharge to the venue listed below.       PT Assessment  Patient needs continued PT services    Follow Up Recommendations  SNF    Does the patient have the potential to tolerate intense rehabilitation      Barriers to Discharge Decreased caregiver support lives alone; daughter works Location manager  None recommended by PT    Recommendations for [employer] Min 3X/week    Precautions / Restrictions Precautions Precautions: Shoulder;Fall Shoulder Interventions: Shoulder sling/immobilizer;For comfort Required Braces or Orthoses: Sling Restrictions RUE Weight Bearing: Non weight bearing   Pertinent Vitals/Pain 8/10 rt shoulder; RN provided medication to assist with pain control; patient repositioned for comfort       Mobility  Bed Mobility Bed Mobility: Sit to Supine Sit to Supine: 3: Mod assist;HOB elevated Details for Bed Mobility Assistance: attempted to educate pt in sit to Lt sidelying, however pt wanted to bring legs up as she laid back on her back; pt ended up diagonally on the bed with assist to get legs on the bed in a position she could lift her hips to scoot laterally towards middle of the bed; explained again the advantages of sit to side and pt agreed to attempt this technique next  time Transfers Transfers: Sit to Stand;Stand to Sit Sit to Stand: 4: Min assist Stand to Sit: 4: Min guard Details for Transfer Assistance: slight lean to her Right as coming to stand Ambulation/Gait Ambulation/Gait Assistance: 4: Min assist Ambulation Distance (Feet): 12 Feet Assistive device: 1 person hand held assist Ambulation/Gait Assistance Details: pt stood for pericare/bath by NT ~3 minutes, performed balance activities and then too fatigued to walk more than 12 ft; pt tearful Gait Pattern: Step-through pattern;Decreased stride length;Wide base of support    Exercises General Exercises - Lower Extremity Hip Flexion/Marching: AAROM;Both;10 reps;Standing Mini-Sqauts: AAROM;Both;10 reps;Standing   PT Diagnosis: Difficulty walking;Acute pain  PT Problem List: Decreased strength;Decreased range of motion;Decreased activity tolerance;Decreased balance;Decreased mobility;Pain PT Treatment Interventions: DME instruction;Gait training;Stair training;Functional mobility training;Therapeutic activities;Therapeutic exercise;Balance training;Patient/family education     PT Goals(Current goals can be found in the care plan section) Acute Rehab PT Goals [name] PT Goal Formulation: With patient Time For Goal Achievement: 07/25/13 Potential to Achieve Goals: Good  Visit Information  Last PT Received On: 07/11/13 Assistance Needed: +1 History of Present Illness: right reverse TSA       Prior Functioning  Home Living Family/patient expects to be discharged to:: Private residence Living Arrangements: Alone Available Help at Discharge: Family;Available PRN/intermittently Type of Home: House Home Access: Stairs to enter [employer] of Steps: 3 Entrance Stairs-Rails: Left Home Layout: One level Home Equipment: Walker - 2 wheels;Cane - single point;Shower seat - built in Prior Function Level of Independence:  Independent;Independent with assistive device(s) Comments:  recently completed OPPT to improve her balance and had progressed to no AD except uses cane when in unfamiliar setting Communication Communication: No difficulties Dominant Hand: Right    Cognition  Cognition Arousal/Alertness: Lethargic Behavior During Therapy: Flat affect Overall Cognitive Status: Impaired/Different from baseline Area of Impairment: Problem solving Problem Solving: Slow processing;Decreased initiation;Requires verbal cues General Comments: pt very flat and states she feels she's "about to have a breakdown.Marland KitchenMarland KitchenI'm trying to hold it together"    Extremity/Trunk Assessment Upper Extremity Assessment Upper Extremity Assessment: Defer to OT evaluation Lower Extremity Assessment Lower Extremity Assessment: RLE deficits/detail;LLE deficits/detail RLE Deficits / Details: AROM to 110; strength grossly 4/5 LLE Deficits / Details: AROM 110; grossly 4/5   Balance Balance Balance Assessed: Yes Static Sitting Balance Static Sitting - Balance Support: No upper extremity supported;Feet supported Static Sitting - Level of Assistance: 5: Stand by assistance Static Standing Balance Static Standing - Balance Support: Left upper extremity supported Static Standing - Level of Assistance: 4: Min assist Static Standing - Comment/# of Minutes: slight drift to Rt Dynamic Standing Balance Dynamic Standing - Balance Support: Left upper extremity supported Dynamic Standing - Level of Assistance: 4: Min assist Dynamic Standing - Balance Activities: Other (comment) (marching, mini-squats) High Level Balance High Level Balance Comments: amb. slow and required hha to LUE to provide support during amb.  End of Session PT - End of Session Equipment Utilized During Treatment: Gait belt;Other (comment) (Rt sling) Activity Tolerance: Patient limited by fatigue Patient left: in bed;with call bell/phone within reach Nurse Communication:  Mobility status  GP     Maria George 07/11/2013, 10:42 AM Pager [phone]

## 2013-07-11 NOTE — Progress Notes (Signed)
Orthopedics Progress Note  Subjective: Patient reports that she is going slow.  She is looking forward to being transferred to [facility].  Objective:  Filed Vitals:   07/11/13 1353  BP: 98/54  Pulse: 98  Temp: 97.8 F (36.6 C)  Resp: 20    General: Awake and alert  Musculoskeletal: right shoulder with minimal pain with AAROM. Neurovascularly intact  Lab Results  Component Value Date   WBC 8.6 06/29/2013   HGB 11.2* 07/09/2013   HCT 33.0* 07/09/2013   MCV 93.6 06/29/2013   PLT 287 06/29/2013       Component Value Date/Time   NA 135 07/09/2013 0415   K 3.7 07/09/2013 0415   CL 100 07/09/2013 0415   CO2 26 07/09/2013 0415   GLUCOSE 113* 07/09/2013 0415   BUN 18 07/09/2013 0415   CREATININE 1.15* 07/09/2013 0415   CALCIUM 8.4 07/09/2013 0415   GFRNONAA 46* 07/09/2013 0415   GFRAA 54* 07/09/2013 0415    Lab Results  Component Value Date   INR 0.92 09/15/2011   INR 0.90 04/28/2011    Assessment/Plan: POD #3 s/p Procedure(s): REVERSE RIGHT SHOULDER ARTHROPLASTY Slow progress with mobilization and AROM/ADLs with the right shoulder  PT/OT  It is ok for her to AROM the shoulder and AAROM is ok.  Abduction is fine as well.  ADL focus and limit weight bearing, like no pushing out of a chair for her.  Thanks!'' FL2 signed  Almedia Balls. Ranell Patrick, MD 07/11/2013 5:19 PM

## 2013-07-11 NOTE — Progress Notes (Signed)
Occupational Therapy Treatment Patient Details Name: Maria George MRN: [id number] DOB: June 12, 1941 Today's Date: 07/11/2013 Time: 8295-6213 OT Time Calculation (min): 38 min  OT Assessment / Plan / Recommendation  History of present illness   R reverse TSA   OT comments  Pt. Con't. To move slow with notable balance deficits with ambulation.  Also unable to perform basic adls with out mod/max a.  Pt. States she did not expect to require this much assistance and requests short term placement snf for con't. Rehab and strengthening prior to d/c home.  Declines rom excercises this am. Secondary to wanting b.fast first.  But states she has not questions on how to complete and will do so later. rn notified of pts. Requests for d/c planning changes.   Follow Up Recommendations  SNF                     Frequency Min 3X/week   Progress towards OT Goals Progress towards OT goals: Progressing toward goals  Plan Discharge plan needs to be updated    Precautions / Restrictions Precautions Precautions: Shoulder Shoulder Interventions: Shoulder sling/immobilizer;For comfort Required Braces or Orthoses: Sling Restrictions RUE Weight Bearing: Non weight bearing   Pertinent Vitals/Pain 3-4/10 before activity    ADL  Grooming: Performed;Moderate assistance Where Assessed - Grooming: Supported standing Lower Body Dressing: Performed;Maximal assistance Where Assessed - Lower Body Dressing: Unsupported sitting Toilet Transfer: Performed;Minimal assistance Toilet Transfer Method: Sit to stand Toilet Transfer Equipment: Regular height toilet;Grab bars Toileting - Clothing Manipulation and Hygiene: Performed;Minimal assistance Where Assessed - Glass blower/designer Manipulation and Hygiene: Standing Transfers/Ambulation Related to ADLs: unsteady with amb., requires hha.  moving slow ADL Comments: unable to perform LB dressing, (limitations with knee flexion b knees from previous sx.), pt.  stating throughout session that she really did not expect to have "this level of difficulty and is not ready for home"      OT Goals(current goals can now be found in the care plan section)    Visit Information  Last OT Received On: 07/11/13    Subjective Data   "i really dont think i can go home right now, i have a dtr. And grandchildren but not help all of the time"          Cognition  Cognition Arousal/Alertness: Awake/alert Behavior During Therapy: WFL for tasks assessed/performed Overall Cognitive Status: Impaired/Different from baseline    Mobility  Bed Mobility Details for Bed Mobility Assistance: mod a to bring trunk upright while transitioning oob with hob elevated.   Transfers Transfers: Sit to Stand;Stand to Sit Sit to Stand: 4: Min assist;3: Mod assist;From bed;With upper extremity assist Stand to Sit: 4: Min assist;3: Mod assist;With armrests;With upper extremity assist;To chair/3-in-1          Balance High Level Balance High Level Balance Comments: amb. slow and required hha to LUE to provide support during amb.   End of Session OT - End of Session Activity Tolerance: Patient tolerated treatment well Patient left: in chair;with call bell/phone within reach Nurse Communication: Mobility status;Other (comment) (informed r.n. of pts. request to change d/c planning)       Robet Leu, COTA/L 07/11/2013, 7:43 AM

## 2013-07-11 NOTE — Care Management Utilization Note (Signed)
Utilization review completed. Ashly Goethe, RN BSN 

## 2013-07-11 NOTE — Clinical Social Work Placement (Addendum)
Clinical Social Work Department  CLINICAL SOCIAL WORK PLACEMENT NOTE   Patient: JADYNN EPPING Account Number: [id number] Admit date: 07/08/13 Clinical Social Worker: Sabino Niemann LCSWA Date/time: 07/11/2013 3:12 PM Clinical Social Work is seeking post-discharge placement for this patient at the following level of care: SKILLED NURSING (*CSW will update this form in Epic as items are completed)  07/11/2013 Patient/family provided with [facility] Department of Clinical Social Work's list of facilities offering this level of care within the geographic area requested by the patient (or if unable, by the patient's family).  07/11/2013  Patient/family informed of their freedom to choose among providers that offer the needed level of care, that participate in Medicare, Medicaid or managed care program needed by the patient, have an available bed and are willing to accept the patient.  07/11/2013 Patient/family informed of [facility]' ownership interest in [facility], as well as of the fact that they are under no obligation to receive care at this facility.  PASARR submitted to EDS on pre-existing [id number]  PASARR number received from EDS on  FL2 transmitted to all facilities in geographic area requested by pt/family on11/24/2014  FL2 transmitted to all facilities within larger geographic area on  Patient informed that his/her managed care company has contracts with or will negotiate with certain facilities, including the following:  Patient/family informed of bed offers received: 07/11/2013  Patient chooses bed at [facility] Physician recommends and patient chooses bed at  Patient to be transferred to on  Patient to be transferred to facility by  The following physician request were entered in Epic:  Additional Comments:

## 2013-07-11 NOTE — Progress Notes (Signed)
   Subjective: 3 Days Post-Op Procedure(s) (LRB): REVERSE RIGHT SHOULDER ARTHROPLASTY (Right)  Pt c/o feeling very weak and no confidence with ADL's Mild to moderate pain in the right shoulder Just a feeling of despair Pt interested in SNF Patient reports pain as moderate.  Objective:   VITALS:   Filed Vitals:   07/11/13 0546  BP: 93/49  Pulse: 96  Temp: 98.9 F (37.2 C)  Resp: 20    Right shoulder incision healing well nv intact distally No rashes or edema Mild weakness vs effort bilateral lower extremities  LABS  Recent Labs  07/09/13 0415  HGB 11.2*  HCT 33.0*     Recent Labs  07/09/13 0415  NA 135  K 3.7  BUN 18  CREATININE 1.15*  GLUCOSE 113*     Assessment/Plan: 3 Days Post-Op Procedure(s) (LRB): REVERSE RIGHT SHOULDER ARTHROPLASTY (Right) Plan for SNF due to weakness Plan for d/c tomorrow to SNF if bed available Continue PT/OT Encourage mobilization     [place], MPAS, PA-C  07/11/2013, 10:36 AM

## 2013-07-12 ENCOUNTER — Encounter (HOSPITAL_COMMUNITY): Payer: Self-pay | Admitting: Orthopedic Surgery

## 2013-07-12 LAB — URINE CULTURE: Culture: NO GROWTH

## 2013-07-12 MED ORDER — METHOCARBAMOL 500 MG PO TABS: 500.0000 mg | ORAL_TABLET | Freq: Four times a day (QID) | ORAL | Status: DC | PRN

## 2013-07-12 MED ORDER — OXYCODONE HCL 5 MG PO TABS: 5.0000 mg | ORAL_TABLET | ORAL | Status: DC | PRN

## 2013-07-12 MED ORDER — CIPROFLOXACIN HCL 500 MG PO TABS: 500.0000 mg | ORAL_TABLET | Freq: Two times a day (BID) | ORAL | Status: DC

## 2013-07-12 NOTE — Progress Notes (Signed)
Covering Clinical Child psychotherapist (CSW) informed that pt will be dc'ing to [facility] today. CSW has prepared and placed dc packet in pt shadow chart. CSW informed that daughter to provide transportation to the facility. No additional needs, CSW signing off.  Theresia Bough, MSW, LCSW [phone]

## 2013-07-12 NOTE — Discharge Summary (Signed)
Physician Discharge Summary   Patient ID: Maria George MRN: [id number] DOB/AGE: [birth year] 72 y.o.  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 female who was admitted 07/08/2013 with a chief complaint of No chief complaint on file. , and found to have a diagnosis of <principal problem not specified>.  They were brought to the operating room on 07/08/2013 and underwent the above named procedures.    The patient had an uncomplicated hospital course and was stable for discharge.  Recent vital signs:  Filed Vitals:   07/12/13 0629  BP: 105/56  Pulse: 85  Temp: 98.1 F (36.7 C)  Resp: 19    Recent laboratory studies:  Results for orders placed during the hospital encounter of 07/08/13  URINE CULTURE      Result Value Range   Specimen Description URINE, CLEAN CATCH     Special Requests NONE     Culture  Setup Time       Value: 07/10/2013 02:05     Performed at [facility] Count       Value: NO GROWTH     Performed at [facility]   Culture       Value: NO GROWTH     Performed at [facility]   Report Status 07/11/2013 FINAL    HEMOGLOBIN AND HEMATOCRIT, BLOOD      Result Value Range   Hemoglobin 11.2 (*) 12.0 - 15.0 g/dL   HCT 27.2 (*) 53.6 - 64.4 %  BASIC METABOLIC PANEL      Result Value Range   Sodium 135  135 - 145 mEq/L   Potassium 3.7  3.5 - 5.1 mEq/L   Chloride 100  96 - 112 mEq/L   CO2 26  19 - 32 mEq/L   Glucose, Bld 113 (*) 70 - 99 mg/dL   BUN 18  6 - 23 mg/dL   Creatinine, Ser 0.34 (*) 0.50 - 1.10 mg/dL   Calcium 8.4  8.4 - 74.2 mg/dL   GFR calc non Af Amer 46 (*) >90 mL/min     GFR calc Af Amer 54 (*) >90 mL/min  URINALYSIS, ROUTINE W REFLEX MICROSCOPIC      Result Value Range   Color, Urine YELLOW  YELLOW   APPearance CLEAR  CLEAR   Specific Gravity, Urine 1.021  1.005 - 1.030   pH 6.0  5.0 - 8.0   Glucose, UA 100 (*) NEGATIVE mg/dL   Hgb urine dipstick NEGATIVE  NEGATIVE   Bilirubin Urine NEGATIVE  NEGATIVE   Ketones, ur NEGATIVE  NEGATIVE mg/dL   Protein, ur NEGATIVE  NEGATIVE mg/dL   Urobilinogen, UA 1.0  0.0 - 1.0 mg/dL   Nitrite NEGATIVE  NEGATIVE   Leukocytes, UA MODERATE (*) NEGATIVE  URINE MICROSCOPIC-ADD ON      Result Value Range   Squamous Epithelial / LPF FEW (*) RARE   WBC, UA 7-10  <3 WBC/hpf  URINALYSIS, ROUTINE W REFLEX MICROSCOPIC      Result Value Range   Color, Urine AMBER (*) YELLOW   APPearance CLOUDY (*) CLEAR   Specific Gravity, Urine 1.020  1.005 - 1.030   pH 6.0  5.0 - 8.0   Glucose,  UA NEGATIVE  NEGATIVE mg/dL   Hgb urine dipstick NEGATIVE  NEGATIVE   Bilirubin Urine SMALL (*) NEGATIVE   Ketones, ur NEGATIVE  NEGATIVE mg/dL   Protein, ur NEGATIVE  NEGATIVE mg/dL   Urobilinogen, UA 1.0  0.0 - 1.0 mg/dL   Nitrite NEGATIVE  NEGATIVE   Leukocytes, UA SMALL (*) NEGATIVE  URINE MICROSCOPIC-ADD ON      Result Value Range   Squamous Epithelial / LPF FEW (*) RARE   WBC, UA 3-6  <3 WBC/hpf   Bacteria, UA RARE  RARE   Casts HYALINE CASTS (*) NEGATIVE   Urine-Other MUCOUS PRESENT      Discharge Medications:     Medication List         ALLERGY RELIEF PO  Take 1 tablet by mouth daily.     ciprofloxacin 500 MG tablet  Commonly known as:  CIPRO  Take 1 tablet (500 mg total) by mouth 2 (two) times daily.     clonazePAM 1 MG tablet  Commonly known as:  KLONOPIN  Take 1 mg by mouth daily.     cyclobenzaprine 10 MG tablet  Commonly known as:  FLEXERIL  Take 10 mg by mouth 3 (three) times daily as needed. Muscle spasm     estradiol 1 MG tablet  Commonly known as:  ESTRACE  Take 1 mg by mouth daily.     hydrOXYzine  50 MG tablet  Commonly known as:  ATARAX/VISTARIL  - Take 50-100 mg by mouth 3 (three) times daily as needed for itching. Itching  -      lisinopril-hydrochlorothiazide 20-25 MG per tablet  Commonly known as:  PRINZIDE,ZESTORETIC  Take 1 tablet by mouth daily before breakfast.     methocarbamol 500 MG tablet  Commonly known as:  ROBAXIN  Take 1 tablet (500 mg total) by mouth 3 (three) times daily as needed for muscle spasms.     methocarbamol 500 MG tablet  Commonly known as:  ROBAXIN  Take 1 tablet (500 mg total) by mouth every 6 (six) hours as needed for muscle spasms.     oxyCODONE 5 MG immediate release tablet  Commonly known as:  Oxy IR/ROXICODONE  Take 1-2 tablets (5-10 mg total) by mouth every 3 (three) hours as needed for breakthrough pain.     oxyCODONE-acetaminophen 5-325 MG per tablet  Commonly known as:  PERCOCET/ROXICET  Take 1 tablet by mouth every 8 (eight) hours as needed for moderate pain or severe pain.     oxyCODONE-acetaminophen 5-325 MG per tablet  Commonly known as:  ROXICET  Take 1-2 tablets by mouth every 4 (four) hours as needed for severe pain.     venlafaxine XR 150 MG 24 hr capsule  Commonly known as:  EFFEXOR-XR  Take 300 mg by mouth daily before breakfast.     Vitamin D (Ergocalciferol) 50000 UNITS Caps capsule  Commonly known as:  DRISDOL  Take 50,000 Units by mouth every 7 (seven) days.     zolpidem 10 MG tablet  Commonly known as:  AMBIEN  Take 10 mg by mouth at bedtime.        Diagnostic Studies: Dg Chest 2 View  06/29/2013   CLINICAL DATA:  Preoperative evaluation for shoulder replacement on the right  EXAM: CHEST  2 VIEW  COMPARISON:  None.  FINDINGS: The heart and pulmonary vascularity are within normal limits. The lungs are clear bilaterally. Severe degenerative changes of the right shoulder joint are seen.  IMPRESSION: No acute abnormality noted.  Electronically Signed   By: Alcide Clever M.D.   On: 06/29/2013 17:46   Dg Shoulder  Right  07/08/2013   CLINICAL DATA:  Status post right shoulder arthroplasty.  EXAM: RIGHT SHOULDER - 2+ VIEW  COMPARISON:  No priors.  FINDINGS: Single view of the right shoulder demonstrates postoperative changes of right total shoulder arthroplasty. Both the glenoid and humeral components of the prosthesis appear properly seated without definite periprosthetic fracture or other immediate complicating features. These appear to articulate with one another on this single view examination. Status post distal clavicular resection.  IMPRESSION: 1. Status post right shoulder arthroplasty, as above, without immediate complicating features.   Electronically Signed   By: Trudie Reed M.D.   On: 07/08/2013 17:52    Disposition: 01-Home or Self Care      Discharge Orders   Future Orders Complete By Expires   Call MD / Call 911  As directed    Comments:     If you experience chest pain or shortness of breath, CALL 911 and be transported to the hospital emergency room.  If you develope a fever above 101 F, pus (white drainage) or increased drainage or redness at the wound, or calf pain, call your surgeon's office.   Constipation Prevention  As directed    Comments:     Drink plenty of fluids.  Prune juice may be helpful.  You may use a stool softener, such as Colace (over the counter) 100 mg twice a day.  Use MiraLax (over the counter) for constipation as needed.   Diet - low sodium heart healthy  As directed    Increase activity slowly as tolerated  As directed       Follow-up Information   Follow up with NORRIS,STEVEN R, MD. Schedule an appointment as soon as possible for a visit in 2 weeks. [phone])    Specialty:  Orthopedic Surgery   Contact information:   [address] [place] [address] [phone]        Signed: Thea Gist 07/12/2013, 7:33 AM

## 2013-07-12 NOTE — Progress Notes (Signed)
Physical Therapy Treatment Patient Details Name: Maria George MRN: [id number] DOB: 12-22-1940 Today's Date: 07/12/2013 Time: 7829-5621 PT Time Calculation (min): 38 min  PT Assessment / Plan / Recommendation  History of Present Illness right reverse TSA   PT Comments   Pt making good progress with mobility.  Increased tx time due to pt needing to use BR.  Pt able to perform pericare independently.  Cont to recommend SNF to maximize pt's strength, balance, & safety with functional mobility.     Follow Up Recommendations  SNF     Does the patient have the potential to tolerate intense rehabilitation     Barriers to Discharge        Equipment Recommendations  None recommended by PT    Recommendations for Other Services    Frequency Min 3X/week   Progress towards PT Goals Progress towards PT goals: Progressing toward goals  Plan Current plan remains appropriate    Precautions / Restrictions Precautions Precautions: Shoulder;Fall Shoulder Interventions: Shoulder sling/immobilizer;For comfort Required Braces or Orthoses: Sling Restrictions RUE Weight Bearing: Non weight bearing       Mobility  Bed Mobility Bed Mobility: Supine to Sit;Sitting - Scoot to Edge of Bed Supine to Sit: 3: Mod assist;With rails;HOB elevated Sitting - Scoot to Edge of Bed: 4: Min assist Details for Bed Mobility Assistance: (A) to lift shoulders/trunk to sitting upright & use of draw pad to pivot hips around to EOB.  Pt able to use LUE to assist with scooting hips closer to EOB.   Transfers Transfers: Sit to Stand;Stand to Sit Sit to Stand: 4: Min guard;With upper extremity assist;From bed;From toilet Stand to Sit: 4: Min guard;With upper extremity assist;With armrests;To chair/3-in-1;To toilet Details for Transfer Assistance: Guarding for safety.  Pt does well with pushing up from surface with LUE.   Ambulation/Gait Ambulation/Gait Assistance: 4: Min assist Ambulation Distance (Feet): 200  Feet Assistive device: 1 person hand held assist Ambulation/Gait Assistance Details: (A) provided via HHA for stability.  Pt with slow small steps.  Relies minimally through therapists' hand for support.   Gait Pattern: Step-through pattern;Decreased stride length (decreased step height) Gait velocity: decreased      PT Goals (current goals can now be found in the care plan section) Acute Rehab PT Goals PT Goal Formulation: With patient Time For Goal Achievement: 07/25/13 Potential to Achieve Goals: Good  Visit Information  Last PT Received On: 07/12/13 Assistance Needed: +1 History of Present Illness: right reverse TSA    Subjective Data      Cognition  Cognition Arousal/Alertness: Awake/alert Behavior During Therapy: WFL for tasks assessed/performed    Balance     End of Session PT - End of Session Equipment Utilized During Treatment: Other (comment) (sling RUE) Activity Tolerance: Patient tolerated treatment well Patient left: in chair;with call bell/phone within reach Nurse Communication: Mobility status   GP     Lara Mulch 07/12/2013, 11:45 AM   Verdell Face, PTA [phone] 07/12/2013

## 2013-07-12 NOTE — Progress Notes (Signed)
I have discussed with COTA and agree with change in D/C plan. Ignacia Palma, OTR/L [phone] 07/12/2013

## 2013-07-12 NOTE — Progress Notes (Signed)
   Subjective: 4 Days Post-Op Procedure(s) (LRB): REVERSE RIGHT SHOULDER ARTHROPLASTY (Right)  Pt feeling a little better today C/o dysuria Plan for d/c to snf today Patient reports pain as mild.  Objective:   VITALS:   Filed Vitals:   07/12/13 0629  BP: 105/56  Pulse: 85  Temp: 98.1 F (36.7 C)  Resp: 19    Right shoulder incision healing well nv intact distally Minimal rash to right axilla but pt has trouble with abduction and guarding movement No signs of infection  LABS No results found for this basename: HGB, HCT, WBC, PLT,  in the last 72 hours  No results found for this basename: NA, K, BUN, CREATININE, GLUCOSE,  in the last 72 hours   Assessment/Plan: 4 Days Post-Op Procedure(s) (LRB): REVERSE RIGHT SHOULDER ARTHROPLASTY (Right) Plan to d/c to snf today F/u in 2 weeks cipro for suspected UTI     Alphonsa Overall, MPAS, PA-C  07/12/2013, 7:29 AM

## 2013-07-12 NOTE — Progress Notes (Signed)
Patient discharged to SNF. IV was removed and patient left unit in a stable condition via wheelchair accompanied by daughter.

## 2013-07-20 NOTE — Addendum Note (Signed)
Addendum created 07/20/13 1429 by Kipp Brood, MD   Modules edited: Anesthesia Blocks and Procedures

## 2013-08-17 NOTE — Addendum Note (Signed)
Addendum created 08/17/13 1040 by Kipp Brood, MD   Modules edited: Anesthesia Blocks and Procedures

## 2014-04-11 ENCOUNTER — Other Ambulatory Visit (HOSPITAL_COMMUNITY): Payer: Self-pay | Admitting: *Deleted

## 2014-04-11 DIAGNOSIS — Z1231 Encounter for screening mammogram for malignant neoplasm of breast: Secondary | ICD-10-CM

## 2014-04-20 ENCOUNTER — Ambulatory Visit (HOSPITAL_COMMUNITY)
Admission: RE | Admit: 2014-04-20 | Discharge: 2014-04-20 | Disposition: A | Discharge status: Home / Self Care | Payer: Medicare Other | Source: Ambulatory Visit | Attending: *Deleted | Admitting: *Deleted

## 2014-04-20 ENCOUNTER — Ambulatory Visit (HOSPITAL_COMMUNITY): Payer: BC Managed Care – PPO

## 2014-04-20 DIAGNOSIS — Z1231 Encounter for screening mammogram for malignant neoplasm of breast: Secondary | ICD-10-CM | POA: Insufficient documentation

## 2014-06-23 ENCOUNTER — Telehealth: Payer: Self-pay | Admitting: Cardiovascular Disease

## 2014-06-26 ENCOUNTER — Telehealth: Payer: Self-pay | Admitting: Cardiovascular Disease

## 2014-06-26 NOTE — Telephone Encounter (Signed)
Closed encounter °

## 2014-06-26 NOTE — Telephone Encounter (Signed)
Received records from [facility]--(Dr Maury Dus) for patient appointment on 06/28/14 with Dr Allyson Sabal.  Records given to [facility] Records) for Dr Hazle Coca schedule on 06/28/14.  lp

## 2014-06-28 ENCOUNTER — Encounter: Payer: Self-pay | Admitting: Cardiovascular Disease

## 2014-06-28 ENCOUNTER — Ambulatory Visit (INDEPENDENT_AMBULATORY_CARE_PROVIDER_SITE_OTHER): Payer: Medicare Other | Admitting: Cardiovascular Disease

## 2014-06-28 VITALS — BP 110/70 | HR 84 | Ht 62.0 in | Wt 205.0 lb

## 2014-06-28 DIAGNOSIS — E785 Hyperlipidemia, unspecified: Secondary | ICD-10-CM

## 2014-06-28 DIAGNOSIS — R0989 Other specified symptoms and signs involving the circulatory and respiratory systems: Secondary | ICD-10-CM | POA: Insufficient documentation

## 2014-06-28 DIAGNOSIS — I10 Essential (primary) hypertension: Secondary | ICD-10-CM | POA: Insufficient documentation

## 2014-06-28 NOTE — Assessment & Plan Note (Signed)
On atorvastatin 20 mg a day. This is followed by her primary care physician

## 2014-06-28 NOTE — Progress Notes (Signed)
06/28/2014 Maria George   08-10-1941  [id number]  Primary Physician Samuel Jester, DO Primary Cardiologist: Runell Gess MD Roseanne Reno   HPI:  Maria George is a 73 year old moderately overweight divorced Caucasian female mother of 2 children, grandmother of 5 grandchildren accompany by one of her daughters today. She is referred by Dr. Elvin So for peripheral vascular evaluation because of poorly palpable pedal pulses prior to elective foot surgery. Her primary care physician is Dr. Samuel Jester. Risk factor for vascular disease are remarkable for tobacco abuse having discontinued that 25 years ago as well as treated hypertension and hyperlipidemia. She does have a family history of heart disease with a father who died of a myocardial infarction. She has never had a heart attack or stroke and denies chest pain or shortness of breath. She denies claudication. She has had bilateral total knee replacements. Doctor Elvin So  was unable to palpate pedal pulses on exam.   Current Outpatient Prescriptions  Medication Sig Dispense Refill  . atorvastatin (LIPITOR) 20 MG tablet 20 mg daily.    . Chlorpheniramine Maleate (ALLERGY RELIEF PO) Take 1 tablet by mouth daily.    . clonazePAM (KLONOPIN) 1 MG tablet Take 1 mg by mouth daily.     . cyclobenzaprine (FLEXERIL) 10 MG tablet Take 10 mg by mouth 3 (three) times daily as needed. Muscle spasm    . estradiol (ESTRACE) 1 MG tablet Take 1 mg by mouth daily.    Marland Kitchen gabapentin (NEURONTIN) 300 MG capsule 2 (two) times daily.    . hydrOXYzine (ATARAX/VISTARIL) 50 MG tablet Take 50-100 mg by mouth 3 (three) times daily as needed for itching. Itching     . lisinopril-hydrochlorothiazide (PRINZIDE,ZESTORETIC) 20-25 MG per tablet Take 1 tablet by mouth daily before breakfast.    . oxyCODONE (OXY IR/ROXICODONE) 5 MG immediate release tablet Take 1-2 tablets (5-10 mg total) by mouth every 3 (three) hours as needed for breakthrough pain. 30  tablet 0  . oxyCODONE-acetaminophen (PERCOCET) 10-325 MG per tablet Take 1 tablet by mouth as needed.     . phentermine (ADIPEX-P) 37.5 MG tablet Take 37.5 mg by mouth daily before breakfast.     . venlafaxine (EFFEXOR-XR) 150 MG 24 hr capsule Take 300 mg by mouth daily before breakfast.    . Vitamin D, Ergocalciferol, (DRISDOL) 50000 UNITS CAPS Take 50,000 Units by mouth every 7 (seven) days.    Marland Kitchen zolpidem (AMBIEN) 10 MG tablet Take 10 mg by mouth at bedtime.     No current facility-administered medications for this visit.    No Known Allergies  History   Social History  . Marital Status: Divorced    Spouse Name: N/A    Number of Children: N/A  . Years of Education: N/A   Occupational History  . Not on file.   Social History Main Topics  . Smoking status: Former Smoker -- 0.25 packs/day for 10 years    Quit date: 08/18/1996  . Smokeless tobacco: Never Used  . Alcohol Use: No  . Drug Use: No  . Sexual Activity: Not on file   Other Topics Concern  . Not on file   Social History Narrative     Review of Systems: General: negative for chills, fever, night sweats or weight changes.  Cardiovascular: negative for chest pain, dyspnea on exertion, edema, orthopnea, palpitations, paroxysmal nocturnal dyspnea or shortness of breath Dermatological: negative for rash Respiratory: negative for cough or wheezing Urologic: negative for hematuria Abdominal: negative for nausea,  vomiting, diarrhea, bright red blood per rectum, melena, or hematemesis Neurologic: negative for visual changes, syncope, or dizziness All other systems reviewed and are otherwise negative except as noted above.    Blood pressure 110/70, pulse 84, height 5\' 2"  (1.575 m), weight 205 lb (92.987 kg).  General appearance: alert and no distress Neck: no adenopathy, no carotid bruit, no JVD, supple, symmetrical, trachea midline and thyroid not enlarged, symmetric, no tenderness/mass/nodules Lungs: clear to  auscultation bilaterally Heart: regular rate and rhythm, S1, S2 normal, no murmur, click, rub or gallop Extremities: extremities normal, atraumatic, no cyanosis or edema and 1+ pedal pulses bilaterally  EKG lab performed today  ASSESSMENT AND PLAN:   Essential hypertension Her blood pressure today is 110/70. She is on lisinopril hydrochlorothiazide is well-controlled. Continue current medications  Absent pedal pulses [name] was [facility] vascular evaluation prior to elective surgery because of poorly palpable pedal pulses.she denies claudication. Her vascular risk factor profiles remarkable for treated hypertension and hyperlipidemia. She does have a family history of heart disease with a father to brothers who died of myocardial infarctions. I had difficulty palpating  pedal pulses to exam. I'm going to get lower extremity arterial Doppler studies to further characterize her circulation prior to clearing her for her upcoming foot surgery.  Hyperlipidemia On atorvastatin 20 mg a day. This is followed by her primary care physician      Runell GessJonathan J. Michelangelo Rindfleisch MD [facility] SurgeryFACP,FACC,FAHA, [facility] SystemFSCAI 06/28/2014 4:28 PM

## 2014-06-28 NOTE — Assessment & Plan Note (Signed)
Her blood pressure today is 110/70. She is on lisinopril hydrochlorothiazide is well-controlled. Continue current medications

## 2014-06-28 NOTE — Assessment & Plan Note (Signed)
Mrs. Maria George was referred by Dr. Elvin So from [facility] for peripheral vascular evaluation prior to elective surgery because of poorly palpable pedal pulses.she denies claudication. Her vascular risk factor profiles remarkable for treated hypertension and hyperlipidemia. She does have a family history of heart disease with a father to brothers who died of myocardial infarctions. I had difficulty palpating  pedal pulses to exam. I'm going to get lower extremity arterial Doppler studies to further characterize her circulation prior to clearing her for her upcoming foot surgery.

## 2014-06-28 NOTE — Patient Instructions (Signed)
  We will see you back in follow up only as needed.   Dr Berry has ordered: lower extremity arterial doppler- During this test, ultrasound is used to evaluate arterial blood flow in the legs. Allow approximately one hour for this exam.      

## 2014-08-02 ENCOUNTER — Ambulatory Visit (HOSPITAL_COMMUNITY)
Admission: RE | Admit: 2014-08-02 | Discharge: 2014-08-02 | Disposition: A | Discharge status: Home / Self Care | Payer: Medicare Other | Source: Ambulatory Visit | Attending: Internal Medicine | Admitting: Internal Medicine

## 2014-08-02 DIAGNOSIS — Z720 Tobacco use: Secondary | ICD-10-CM | POA: Diagnosis not present

## 2014-08-02 DIAGNOSIS — E785 Hyperlipidemia, unspecified: Secondary | ICD-10-CM | POA: Diagnosis not present

## 2014-08-02 DIAGNOSIS — R0989 Other specified symptoms and signs involving the circulatory and respiratory systems: Secondary | ICD-10-CM | POA: Diagnosis present

## 2014-08-02 DIAGNOSIS — Z01818 Encounter for other preprocedural examination: Secondary | ICD-10-CM | POA: Insufficient documentation

## 2014-08-02 DIAGNOSIS — I10 Essential (primary) hypertension: Secondary | ICD-10-CM | POA: Diagnosis not present

## 2014-08-02 NOTE — Progress Notes (Signed)
Arterial Lower Ext. Duplex Completed. Bilateral ABIs, TBIs and Duplex imaging demonstrated normal values with no evidence of stenosis noted.  Marilynne Halsted, BS, RDMS, RVT

## 2014-08-09 ENCOUNTER — Encounter: Payer: Self-pay | Admitting: *Deleted

## 2015-05-09 ENCOUNTER — Encounter: Payer: Self-pay | Admitting: Gastroenterology

## 2015-12-22 ENCOUNTER — Emergency Department (HOSPITAL_COMMUNITY): Payer: Medicare Other

## 2015-12-22 ENCOUNTER — Encounter (HOSPITAL_COMMUNITY): Payer: Self-pay | Admitting: Emergency Medicine

## 2015-12-22 ENCOUNTER — Emergency Department (HOSPITAL_COMMUNITY)
Admission: EM | Admit: 2015-12-22 | Discharge: 2015-12-22 | Disposition: A | Discharge status: Home / Self Care | Payer: Medicare Other | Attending: Emergency Medicine | Admitting: Emergency Medicine

## 2015-12-22 DIAGNOSIS — Z87891 Personal history of nicotine dependence: Secondary | ICD-10-CM | POA: Insufficient documentation

## 2015-12-22 DIAGNOSIS — Z79891 Long term (current) use of opiate analgesic: Secondary | ICD-10-CM | POA: Diagnosis not present

## 2015-12-22 DIAGNOSIS — Y9289 Other specified places as the place of occurrence of the external cause: Secondary | ICD-10-CM | POA: Diagnosis not present

## 2015-12-22 DIAGNOSIS — Z91010 Allergy to peanuts: Secondary | ICD-10-CM | POA: Diagnosis not present

## 2015-12-22 DIAGNOSIS — S43402A Unspecified sprain of left shoulder joint, initial encounter: Secondary | ICD-10-CM

## 2015-12-22 DIAGNOSIS — F329 Major depressive disorder, single episode, unspecified: Secondary | ICD-10-CM | POA: Insufficient documentation

## 2015-12-22 DIAGNOSIS — M542 Cervicalgia: Secondary | ICD-10-CM | POA: Diagnosis present

## 2015-12-22 DIAGNOSIS — Y999 Unspecified external cause status: Secondary | ICD-10-CM | POA: Insufficient documentation

## 2015-12-22 DIAGNOSIS — I10 Essential (primary) hypertension: Secondary | ICD-10-CM | POA: Diagnosis not present

## 2015-12-22 DIAGNOSIS — W19XXXA Unspecified fall, initial encounter: Secondary | ICD-10-CM

## 2015-12-22 DIAGNOSIS — M810 Age-related osteoporosis without current pathological fracture: Secondary | ICD-10-CM | POA: Diagnosis not present

## 2015-12-22 DIAGNOSIS — Y939 Activity, unspecified: Secondary | ICD-10-CM | POA: Insufficient documentation

## 2015-12-22 DIAGNOSIS — Z96652 Presence of left artificial knee joint: Secondary | ICD-10-CM | POA: Diagnosis not present

## 2015-12-22 DIAGNOSIS — M1990 Unspecified osteoarthritis, unspecified site: Secondary | ICD-10-CM | POA: Diagnosis not present

## 2015-12-22 DIAGNOSIS — W010XXA Fall on same level from slipping, tripping and stumbling without subsequent striking against object, initial encounter: Secondary | ICD-10-CM | POA: Insufficient documentation

## 2015-12-22 DIAGNOSIS — Z79899 Other long term (current) drug therapy: Secondary | ICD-10-CM | POA: Diagnosis not present

## 2015-12-22 MED ORDER — HYDROCODONE-ACETAMINOPHEN 5-325 MG PO TABS: 1.0000 | ORAL_TABLET | Freq: Four times a day (QID) | ORAL | Status: DC | PRN

## 2015-12-22 MED ORDER — HYDROCODONE-ACETAMINOPHEN 5-325 MG PO TABS
1.0000 | ORAL_TABLET | Freq: Once | ORAL | Status: AC
Start: 2015-12-22 — End: 2015-12-22
  Administered 2015-12-22: 1 via ORAL
  Filled 2015-12-22: qty 1

## 2015-12-22 NOTE — Discharge Instructions (Signed)
Shoulder Sprain °A shoulder sprain is a partial or complete tear in one of the tough, fiber-like tissues (ligaments) in the shoulder. The ligaments in the shoulder help to hold the shoulder in place. °CAUSES °This condition may be caused by: °· A fall. °· A hit to the shoulder. °· A twist of the arm. °RISK FACTORS °This condition is more likely to develop in: °· People who play sports. °· People who have problems with balance or coordination. °SYMPTOMS °Symptoms of this condition include: °· Pain when moving the shoulder. °· Limited ability to move the shoulder. °· Swelling and tenderness on top of the shoulder. °· Warmth in the shoulder. °· A change in the shape of the shoulder. °· Redness or bruising on the shoulder. °DIAGNOSIS °This condition is diagnosed with a physical exam. During the exam, you may be asked to do simple exercises with your shoulder. You may also have imaging tests, such as X-rays, MRI, or a CT scan. These tests can show how severe the sprain is. °TREATMENT °This condition may be treated with: °· Rest. °· Pain medicine. °· Ice. °· A sling or brace. This is used to keep the arm still while the shoulder is healing. °· Physical therapy or rehabilitation exercises. These help to improve the range of motion and strength of the shoulder. °· Surgery (rare). Surgery may be needed if the sprain caused a joint to become unstable. Surgery may also be needed to reduce pain. °Some people may develop ongoing shoulder pain or lose some range of motion in the shoulder. However, most people do not develop long-term problems. °HOME CARE INSTRUCTIONS °· Rest. °· Take over-the-counter and prescription medicines only as told by your health care provider. °· If directed, apply ice to the area: °¨ Put ice in a plastic bag. °¨ Place a towel between your skin and the bag. °¨ Leave the ice on for 20 minutes, 2-3 times per day. °· If you were given a shoulder sling or brace: °¨ Wear it as told. °¨ Remove it to shower or  bathe. °¨ Move your arm only as much as told by your health care provider, but keep your hand moving to prevent swelling. °· If you were shown how to do any exercises, do them as told by your health care provider. °· Keep all follow-up visits as told by your health care provider. This is important. °SEEK MEDICAL CARE IF: °· Your pain gets worse. °· Your pain is not relieved with medicines. °· You have increased redness or swelling. °SEEK IMMEDIATE MEDICAL CARE IF: °· You have a fever. °· You cannot move your arm or shoulder. °· You develop numbness or tingling in your arms, hands, or fingers. °  °This information is not intended to replace advice given to you by your health care provider. Make sure you discuss any questions you have with your health care provider. °  °Document Released: 12/21/2008 Document Revised: 04/25/2015 Document Reviewed: 11/27/2014 °Elsevier Interactive Patient Education ©2016 [employer] Inc. ° °

## 2015-12-22 NOTE — ED Notes (Signed)
Pt with fall in her bedroom tripping over a bedframe. Pt fell into a bookcase and then the floor. Pt with lac to L shin, c/o pain to neck, L shoulder, and L arm. Pt with deformity to L shoulder. Also, c/o pain to back. No LOC.

## 2015-12-22 NOTE — ED Notes (Signed)
Pt ambulates with assistance.

## 2015-12-22 NOTE — ED Provider Notes (Signed)
CSN: [id number]     Arrival date & time 12/22/15  1356 History   First MD Initiated Contact with Patient 12/22/15 1422     Chief Complaint  Patient presents with  . Fall     (Consider location/radiation/quality/duration/timing/severity/associated sxs/prior Treatment) HPI Comments: Pt comes in post mechanical fall. Pt not on anticoagulants, not on  Patient is a 75 y.o. female presenting with fall. The history is provided by the patient.  Fall Pertinent negatives include no chest pain, no abdominal pain, no headaches and no shortness of breath.    Past Medical History  Diagnosis Date  . Hypertension   . Osteoporosis   . Depression   . Migraine     has been yrs.  . Arthritis   . GERD (gastroesophageal reflux disease)     otc   Past Surgical History  Procedure Laterality Date  . Knee arthroscopy  jan 2012    bilateral knees  . Arthrscopy bilateral shoulder  yrs ago    right x 1, left x 1  . Breast biopsy bilateral  yrs ago  . Abdominal hysterectomy  1974  . Cervical laminectomy  1980's  . Lower back surgery  1990's  . Bilateral rotator cuff repair  2003  . Tonsillectomy  1970's  . Total knee arthroplasty  sept 2012  . Rectocle and cystocele repair  late 1980's  . Joint replacement  04/2012    Left Knee  . Total knee arthroplasty  09/17/2011    Procedure: TOTAL KNEE ARTHROPLASTY;  Surgeon: Loanne Drilling, MD;  Location: [facility] ORS;  Service: Orthopedics;  Laterality: Right;  . Eye surgery    . Reverse shoulder arthroplasty Right 07/08/2013    Procedure: REVERSE RIGHT SHOULDER ARTHROPLASTY;  Surgeon: [name], MD;  Location: [facility] OR;  Service: Orthopedics;  Laterality: Right;   Family History  Problem Relation Age of Onset  . Cancer Mother     Breast CA  . Heart attack Father   . Cancer Maternal Grandmother     Breast CA   Social History  Substance Use Topics  . Smoking status: Former Smoker -- 0.25 packs/day for 10 years    Quit date: 08/18/1996  . Smokeless  tobacco: Never Used  . Alcohol Use: No   OB History    No data available     Review of Systems  Constitutional: Positive for activity change.  Respiratory: Negative for shortness of breath.   Cardiovascular: Negative for chest pain.  Gastrointestinal: Negative for nausea, vomiting and abdominal pain.  Genitourinary: Negative for dysuria.  Musculoskeletal: Positive for myalgias, arthralgias and neck pain. Negative for neck stiffness.  Neurological: Negative for headaches.  Hematological: Does not bruise/bleed easily.      Allergies  Peanut-containing drug products  Home Medications   Prior to Admission medications   Medication Sig Start Date End Date Taking? Authorizing Provider  atorvastatin (LIPITOR) 10 MG tablet Take 10 mg by mouth daily. 10/08/15  Yes Historical Provider, MD  clonazePAM (KLONOPIN) 1 MG tablet Take 1 mg by mouth daily.    Yes Historical Provider, MD  gabapentin (NEURONTIN) 300 MG capsule Take 600 mg by mouth 3 (three) times daily.  06/13/14  Yes Historical Provider, MD  lisinopril-hydrochlorothiazide (PRINZIDE,ZESTORETIC) 20-25 MG per tablet Take 1 tablet by mouth daily before breakfast.   Yes Historical Provider, MD  oxyCODONE-acetaminophen (PERCOCET) 10-325 MG per tablet Take 1 tablet by mouth every 6 (six) hours as needed for pain.  06/16/14  Yes Historical Provider, MD  venlafaxine (EFFEXOR-XR) 150 MG 24 hr capsule Take 300 mg by mouth daily with breakfast.    Yes Historical Provider, MD  zolpidem (AMBIEN) 10 MG tablet Take 10 mg by mouth at bedtime as needed for sleep.    Yes Historical Provider, MD  HYDROcodone-acetaminophen (NORCO/VICODIN) 5-325 MG tablet Take 1 tablet by mouth every 6 (six) hours as needed. 12/22/15   Derwood Kaplan, MD  HYDROcodone-acetaminophen (NORCO/VICODIN) 5-325 MG tablet Take 1 tablet by mouth every 6 (six) hours as needed. 12/22/15   Maxamilian Amadon, MD   BP 147/86 mmHg  Pulse 80  Temp(Src) 97.6 F (36.4 C) (Oral)  Resp 20  SpO2  97% Physical Exam  Constitutional: She is oriented to person, place, and time. She appears well-developed.  HENT:  Head: Normocephalic and atraumatic.  Eyes: EOM are normal.  Neck: Normal range of motion. Neck supple.  No midline c-spine tenderness, pt able to turn head to 45 degrees bilaterally without any pain and able to flex neck to the chest and extend without any pain or neurologic symptoms.  Cardiovascular: Normal rate.   Pulmonary/Chest: Effort normal.  Abdominal: Bowel sounds are normal.  Musculoskeletal:  Head to toe evaluation shows no hematoma, bleeding of the scalp, no facial abrasions, step offs, crepitus, no tenderness to palpation of the bilateral upper and lower extremities, no gross deformities, no chest tenderness, no pelvic pain.  Pt ambulated  Neurological: She is alert and oriented to person, place, and time.  Skin: Skin is warm and dry.  Nursing note and vitals reviewed.   ED Course  Procedures (including critical care time) Labs Review Labs Reviewed - No data to display  Imaging Review Ct Head Wo Contrast  12/22/2015  CLINICAL DATA:  Recent fall. EXAM: CT HEAD WITHOUT CONTRAST CT CERVICAL SPINE WITHOUT CONTRAST TECHNIQUE: Multidetector CT imaging of the head and cervical spine was performed following the standard protocol without intravenous contrast. Multiplanar CT image reconstructions of the cervical spine were also generated. COMPARISON:  06/27/2012 FINDINGS: CT HEAD FINDINGS Again noted are scattered areas of low-density throughout the white matter suggestive for chronic changes. No evidence for acute hemorrhage, mass lesion, midline shift, hydrocephalus or large infarct. Mild mucosal thickening in the maxillary sinuses. No calvarial fracture. CT CERVICAL SPINE FINDINGS No significant soft tissue swelling in the neck. Mild scarring at the left lung apex. Negative for pneumothorax. Negative for a fracture or dislocation in cervical spine. There is straightening  in the cervical spine which is unchanged. Minimal anterolisthesis at C3-C4 is stable. Bilateral facet arthropathy. There is disc space narrowing and endplate disease at C4 through C7. Marked disc space narrowing at C5-C6. IMPRESSION: No acute intracranial abnormality. Diffuse white matter changes are suggestive for chronic small vessel ischemic disease. Multilevel degenerative changes in cervical spine without acute bone abnormality. Electronically Signed   By: Richarda Overlie M.D.   On: 12/22/2015 15:11   Ct Cervical Spine Wo Contrast  12/22/2015  CLINICAL DATA:  Recent fall. EXAM: CT HEAD WITHOUT CONTRAST CT CERVICAL SPINE WITHOUT CONTRAST TECHNIQUE: Multidetector CT imaging of the head and cervical spine was performed following the standard protocol without intravenous contrast. Multiplanar CT image reconstructions of the cervical spine were also generated. COMPARISON:  06/27/2012 FINDINGS: CT HEAD FINDINGS Again noted are scattered areas of low-density throughout the white matter suggestive for chronic changes. No evidence for acute hemorrhage, mass lesion, midline shift, hydrocephalus or large infarct. Mild mucosal thickening in the maxillary sinuses. No calvarial fracture. CT CERVICAL SPINE FINDINGS No significant  soft tissue swelling in the neck. Mild scarring at the left lung apex. Negative for pneumothorax. Negative for a fracture or dislocation in cervical spine. There is straightening in the cervical spine which is unchanged. Minimal anterolisthesis at C3-C4 is stable. Bilateral facet arthropathy. There is disc space narrowing and endplate disease at C4 through C7. Marked disc space narrowing at C5-C6. IMPRESSION: No acute intracranial abnormality. Diffuse white matter changes are suggestive for chronic small vessel ischemic disease. Multilevel degenerative changes in cervical spine without acute bone abnormality. Electronically Signed   By: Richarda Overlie M.D.   On: 12/22/2015 15:11   Dg Shoulder  Left  12/22/2015  CLINICAL DATA:  Patient status post fall. EXAM: LEFT SHOULDER - 2+ VIEW COMPARISON:  Left shoulder MRI 06/09/2004 FINDINGS: High riding humeral head. Left AC joint degenerative changes. Shoulder joint degenerative changes. No evidence for acute fracture or dislocation. Visualized left hemi thorax is unremarkable. IMPRESSION: No acute osseous abnormality. Shoulder joint degenerative changes. Electronically Signed   By: Annia Belt M.D.   On: 12/22/2015 14:59   I have personally reviewed and evaluated these images and lab results as part of my medical decision-making.   EKG Interpretation None      MDM   Final diagnoses:  Shoulder sprain, left, initial encounter   Likely shoulder sprain and contusion - f/u with Ortho provided as pt is concerned about re-tearing her rotator cuff.   Derwood Kaplan, MD 12/22/15 1708

## 2015-12-22 NOTE — ED Notes (Signed)
Bed: SW96 Expected date:  Expected time:  Means of arrival:  Comments: EMS- 74 fall

## 2017-08-24 ENCOUNTER — Other Ambulatory Visit: Payer: Self-pay | Admitting: Orthopedic Surgery

## 2017-08-24 DIAGNOSIS — M533 Sacrococcygeal disorders, not elsewhere classified: Secondary | ICD-10-CM

## 2017-09-07 ENCOUNTER — Ambulatory Visit
Admission: RE | Admit: 2017-09-07 | Discharge: 2017-09-07 | Disposition: A | Discharge status: Home / Self Care | Payer: Medicare Other | Source: Ambulatory Visit | Attending: Orthopedic Surgery | Admitting: Orthopedic Surgery

## 2017-09-07 DIAGNOSIS — M533 Sacrococcygeal disorders, not elsewhere classified: Secondary | ICD-10-CM

## 2018-09-20 ENCOUNTER — Inpatient Hospital Stay (HOSPITAL_COMMUNITY)
Admission: EM | Admit: 2018-09-20 | Discharge: 2018-09-23 | DRG: 683 | Disposition: A | Discharge status: Home Health | Payer: Medicare Other | Attending: Internal Medicine | Admitting: Internal Medicine

## 2018-09-20 ENCOUNTER — Other Ambulatory Visit: Payer: Self-pay

## 2018-09-20 ENCOUNTER — Emergency Department (HOSPITAL_COMMUNITY): Payer: Medicare Other

## 2018-09-20 ENCOUNTER — Encounter (HOSPITAL_COMMUNITY): Payer: Self-pay

## 2018-09-20 DIAGNOSIS — R197 Diarrhea, unspecified: Secondary | ICD-10-CM | POA: Diagnosis present

## 2018-09-20 DIAGNOSIS — Z96611 Presence of right artificial shoulder joint: Secondary | ICD-10-CM | POA: Diagnosis present

## 2018-09-20 DIAGNOSIS — K5900 Constipation, unspecified: Secondary | ICD-10-CM | POA: Diagnosis present

## 2018-09-20 DIAGNOSIS — I10 Essential (primary) hypertension: Secondary | ICD-10-CM | POA: Diagnosis present

## 2018-09-20 DIAGNOSIS — K8020 Calculus of gallbladder without cholecystitis without obstruction: Secondary | ICD-10-CM | POA: Diagnosis present

## 2018-09-20 DIAGNOSIS — E860 Dehydration: Secondary | ICD-10-CM | POA: Diagnosis not present

## 2018-09-20 DIAGNOSIS — Z79899 Other long term (current) drug therapy: Secondary | ICD-10-CM

## 2018-09-20 DIAGNOSIS — I951 Orthostatic hypotension: Secondary | ICD-10-CM | POA: Diagnosis present

## 2018-09-20 DIAGNOSIS — K219 Gastro-esophageal reflux disease without esophagitis: Secondary | ICD-10-CM | POA: Diagnosis present

## 2018-09-20 DIAGNOSIS — Z96653 Presence of artificial knee joint, bilateral: Secondary | ICD-10-CM | POA: Diagnosis present

## 2018-09-20 DIAGNOSIS — R112 Nausea with vomiting, unspecified: Secondary | ICD-10-CM | POA: Diagnosis present

## 2018-09-20 DIAGNOSIS — E872 Acidosis, unspecified: Secondary | ICD-10-CM

## 2018-09-20 DIAGNOSIS — F419 Anxiety disorder, unspecified: Secondary | ICD-10-CM | POA: Diagnosis present

## 2018-09-20 DIAGNOSIS — N179 Acute kidney failure, unspecified: Principal | ICD-10-CM | POA: Diagnosis present

## 2018-09-20 DIAGNOSIS — F329 Major depressive disorder, single episode, unspecified: Secondary | ICD-10-CM | POA: Diagnosis present

## 2018-09-20 DIAGNOSIS — M810 Age-related osteoporosis without current pathological fracture: Secondary | ICD-10-CM | POA: Diagnosis present

## 2018-09-20 DIAGNOSIS — Z8249 Family history of ischemic heart disease and other diseases of the circulatory system: Secondary | ICD-10-CM

## 2018-09-20 DIAGNOSIS — E785 Hyperlipidemia, unspecified: Secondary | ICD-10-CM | POA: Diagnosis present

## 2018-09-20 DIAGNOSIS — K449 Diaphragmatic hernia without obstruction or gangrene: Secondary | ICD-10-CM | POA: Diagnosis present

## 2018-09-20 DIAGNOSIS — E876 Hypokalemia: Secondary | ICD-10-CM | POA: Diagnosis present

## 2018-09-20 DIAGNOSIS — R6510 Systemic inflammatory response syndrome (SIRS) of non-infectious origin without acute organ dysfunction: Secondary | ICD-10-CM

## 2018-09-20 DIAGNOSIS — Z91010 Allergy to peanuts: Secondary | ICD-10-CM

## 2018-09-20 DIAGNOSIS — Z90710 Acquired absence of both cervix and uterus: Secondary | ICD-10-CM

## 2018-09-20 DIAGNOSIS — Z87891 Personal history of nicotine dependence: Secondary | ICD-10-CM

## 2018-09-20 LAB — CBC WITH DIFFERENTIAL/PLATELET
Abs Immature Granulocytes: 0.04 10*3/uL (ref 0.00–0.07)
Basophils Absolute: 0.1 10*3/uL (ref 0.0–0.1)
Basophils Relative: 1 %
Eosinophils Absolute: 0.3 10*3/uL (ref 0.0–0.5)
Eosinophils Relative: 2 %
HEMATOCRIT: 42.9 % (ref 36.0–46.0)
Hemoglobin: 13.7 g/dL (ref 12.0–15.0)
Immature Granulocytes: 0 %
Lymphocytes Relative: 9 %
Lymphs Abs: 1 10*3/uL (ref 0.7–4.0)
MCH: 30.9 pg (ref 26.0–34.0)
MCHC: 31.9 g/dL (ref 30.0–36.0)
MCV: 96.8 fL (ref 80.0–100.0)
MONOS PCT: 8 %
Monocytes Absolute: 0.9 10*3/uL (ref 0.1–1.0)
NEUTROS PCT: 80 %
Neutro Abs: 9.6 10*3/uL — ABNORMAL HIGH (ref 1.7–7.7)
Platelets: 362 10*3/uL (ref 150–400)
RBC: 4.43 MIL/uL (ref 3.87–5.11)
RDW: 12.5 % (ref 11.5–15.5)
WBC: 11.9 10*3/uL — ABNORMAL HIGH (ref 4.0–10.5)
nRBC: 0 % (ref 0.0–0.2)

## 2018-09-20 LAB — LACTIC ACID, PLASMA: Lactic Acid, Venous: 2.6 mmol/L (ref 0.5–1.9)

## 2018-09-20 MED ORDER — SODIUM CHLORIDE 0.9 % IV BOLUS
1000.0000 mL | Freq: Once | INTRAVENOUS | Status: AC
  Administered 2018-09-21: 1000 mL via INTRAVENOUS

## 2018-09-20 MED ORDER — SODIUM CHLORIDE 0.9% FLUSH: 3.0000 mL | Freq: Once | INTRAVENOUS | Status: DC

## 2018-09-20 MED ORDER — SODIUM CHLORIDE 0.9 % IV BOLUS
1000.0000 mL | Freq: Once | INTRAVENOUS | Status: AC
  Administered 2018-09-21 (×2): 1000 mL via INTRAVENOUS

## 2018-09-20 NOTE — ED Provider Notes (Signed)
[facility]-EMERGENCY DEPT Provider Note   CSN: [id number] Arrival date & time: 09/20/18  2238     History   Chief Complaint Chief Complaint  Patient presents with  . Emesis  . Near Syncope    HPI Maria George is a 78 y.o. female.  HPI   78 year old female with past medical history as below here with near syncopal episode.  The patient states that she had surgery on her right foot on 1/28.  Since then, she has had mild abdominal pain, which she believes is due to constipation.  She is had minimal p.o. intake.  She was going to the bathroom today, then experienced dizziness every time she tried to stand up.  She also had an episode of nausea and vomiting just prior to the onset of symptoms.  She denies any cough.  No sputum production.  Her pain in her leg is improving.  Denies any calf swelling or tenderness.  No chest pain or shortness of breath.  She continues to feel lightheaded upon standing, but denies any other complaints.  She has been taking hydrocodone for her pain.  She had some diarrhea today as well.  No known recent sick contacts.  No fevers or chills.  No other complaints.  Past Medical History:  Diagnosis Date  . Arthritis   . Depression   . GERD (gastroesophageal reflux disease)    otc  . Hypertension   . Migraine    has been yrs.  . Osteoporosis     Patient Active Problem List   Diagnosis Date Noted  . ARF (acute renal failure) (HCC) 09/21/2018  . Nausea vomiting and diarrhea 09/21/2018  . Acute renal failure (ARF) (HCC) 09/21/2018  . Essential hypertension 06/28/2014  . Absent pedal pulses 06/28/2014  . Hyperlipidemia 06/28/2014  . OA (osteoarthritis) of knee 09/17/2011    Past Surgical History:  Procedure Laterality Date  . ABDOMINAL HYSTERECTOMY  1974  . arthrscopy bilateral shoulder  yrs ago   right x 1, left x 1  . bilateral rotator cuff repair  2003  . breast biopsy bilateral  yrs ago  . CERVICAL LAMINECTOMY  1980's    . EYE SURGERY    . JOINT REPLACEMENT  04/2012   Left Knee  . KNEE ARTHROSCOPY  jan 2012   bilateral knees  . lower back surgery  1990's  . rectocle and cystocele repair  late 1980's  . REVERSE SHOULDER ARTHROPLASTY Right 07/08/2013   Procedure: REVERSE RIGHT SHOULDER ARTHROPLASTY;  Surgeon: [name], MD;  Location: [facility] OR;  Service: Orthopedics;  Laterality: Right;  . TONSILLECTOMY  1970's  . TOTAL KNEE ARTHROPLASTY  sept 2012  . TOTAL KNEE ARTHROPLASTY  09/17/2011   Procedure: TOTAL KNEE ARTHROPLASTY;  Surgeon: Loanne Drilling, MD;  Location: [facility] ORS;  Service: Orthopedics;  Laterality: Right;     OB History   No obstetric history on file.      Home Medications    Prior to Admission medications   Medication Sig Start Date End Date Taking? Authorizing Provider  BLACK COHOSH EXTRACT PO Take 1 tablet by mouth daily.   Yes [provider]  clonazePAM (KLONOPIN) 0.5 MG tablet Take 0.5 mg by mouth 2 (two) times daily as needed for anxiety.   Yes [provider]  cyclobenzaprine (FLEXERIL) 10 MG tablet Take 10 mg by mouth 3 (three) times daily as needed for muscle spasms.   Yes [provider]  gabapentin (NEURONTIN) 300 MG capsule  Take 600 mg by mouth 2 (two) times daily.  06/13/14  Yes [provider]  hydrOXYzine (ATARAX/VISTARIL) 25 MG tablet Take 25 mg by mouth 3 (three) times daily as needed for itching.   Yes [provider]  lisinopril-hydrochlorothiazide (PRINZIDE,ZESTORETIC) 20-25 MG per tablet Take 1 tablet by mouth daily before breakfast.   Yes [provider]  naproxen sodium (ALEVE) 220 MG tablet Take 220 mg by mouth 2 (two) times daily as needed (pain).   Yes [provider]  oxyCODONE (OXY IR/ROXICODONE) 5 MG immediate release tablet Take 5 mg by mouth every 6 (six) hours as needed for severe pain.  09/14/18  Yes [provider]  oxyCODONE-acetaminophen (PERCOCET) 10-325 MG per tablet Take 1 tablet  by mouth every 6 (six) hours as needed for pain.  06/16/14  Yes [provider]  Polyethyl Glycol-Propyl Glycol (SYSTANE) 0.4-0.3 % SOLN Apply 1 drop to eye 3 (three) times daily as needed (dry eyes).   Yes [provider]  venlafaxine (EFFEXOR-XR) 150 MG 24 hr capsule Take 150 mg by mouth daily with breakfast.    Yes [provider]  Vitamin D, Ergocalciferol, (DRISDOL) 1.25 MG (50000 UT) CAPS capsule Take 50,000 Units by mouth 2 (two) times a week. Monday and Thursday   Yes [provider]  zolpidem (AMBIEN) 10 MG tablet Take 10 mg by mouth at bedtime as needed for sleep.    Yes [provider]  HYDROcodone-acetaminophen (NORCO/VICODIN) 5-325 MG tablet Take 1 tablet by mouth every 6 (six) hours as needed. Patient not taking: Reported on 09/21/2018 12/22/15   Derwood Kaplan, MD  HYDROcodone-acetaminophen (NORCO/VICODIN) 5-325 MG tablet Take 1 tablet by mouth every 6 (six) hours as needed. Patient not taking: Reported on 09/21/2018 12/22/15   Derwood Kaplan, MD    Family History Family History  Problem Relation Age of Onset  . Cancer Mother        Breast CA  . Heart attack Father   . Cancer Maternal Grandmother        Breast CA    Social History Social History   Tobacco Use  . Smoking status: Former Smoker    Packs/day: 0.25    Years: 10.00    Pack years: 2.50    Last attempt to quit: 08/18/1996    Years since quitting: 22.1  . Smokeless tobacco: Never Used  Substance Use Topics  . Alcohol use: No  . Drug use: No     Allergies   Peanut-containing drug products   Review of Systems Review of Systems  Constitutional: Positive for fatigue. Negative for chills and fever.  HENT: Negative for congestion and rhinorrhea.   Eyes: Negative for visual disturbance.  Respiratory: Negative for cough, shortness of breath and wheezing.   Cardiovascular: Negative for chest pain and leg swelling.  Gastrointestinal: Positive for abdominal distention,  abdominal pain, constipation and nausea. Negative for diarrhea and vomiting.  Genitourinary: Negative for dysuria and flank pain.  Musculoskeletal: Negative for neck pain and neck stiffness.  Skin: Negative for rash and wound.  Allergic/Immunologic: Negative for immunocompromised state.  Neurological: Positive for dizziness and light-headedness. Negative for syncope, weakness and headaches.  All other systems reviewed and are negative.    Physical Exam Updated Vital Signs BP 117/64   Pulse 88   Temp 98.5 F (36.9 C) (Oral)   Resp 16   Ht 5\' 2"  (1.575 m)   Wt 83.9 kg   SpO2 95%   BMI 33.84 kg/m  Physical Exam Vitals signs and nursing note reviewed.  Constitutional:      General: She is not in acute distress.    Appearance: She is well-developed.  HENT:     Head: Normocephalic and atraumatic.     Comments: Markedly dry MM Eyes:     Conjunctiva/sclera: Conjunctivae normal.  Neck:     Musculoskeletal: Neck supple.  Cardiovascular:     Rate and Rhythm: Normal rate and regular rhythm.     Heart sounds: Normal heart sounds. No murmur. No friction rub.  Pulmonary:     Effort: Pulmonary effort is normal. No respiratory distress.     Breath sounds: Normal breath sounds. No wheezing or rales.  Abdominal:     General: Bowel sounds are decreased. There is no distension.     Palpations: Abdomen is soft.     Tenderness: There is no abdominal tenderness.  Skin:    General: Skin is warm.     Capillary Refill: Capillary refill takes less than 2 seconds.  Neurological:     Mental Status: She is alert and oriented to person, place, and time.     Motor: No abnormal muscle tone.      ED Treatments / Results  Labs (all labs ordered are listed, but only abnormal results are displayed) Labs Reviewed  LACTIC ACID, PLASMA - Abnormal; Notable for the following components:      Result Value   Lactic Acid, Venous 2.6 (*)    All other components within normal limits  COMPREHENSIVE  METABOLIC PANEL - Abnormal; Notable for the following components:   Glucose, Bld 133 (*)    BUN 50 (*)    Creatinine, Ser 2.57 (*)    GFR calc non Af Amer 17 (*)    GFR calc Af Amer 20 (*)    All other components within normal limits  CBC WITH DIFFERENTIAL/PLATELET - Abnormal; Notable for the following components:   WBC 11.9 (*)    Neutro Abs 9.6 (*)    All other components within normal limits  URINALYSIS, ROUTINE W REFLEX MICROSCOPIC - Abnormal; Notable for the following components:   Color, Urine AMBER (*)    APPearance HAZY (*)    Protein, ur 30 (*)    Bacteria, UA RARE (*)    All other components within normal limits  HEPATIC FUNCTION PANEL - Abnormal; Notable for the following components:   Total Protein 6.2 (*)    Albumin 3.0 (*)    All other components within normal limits  BASIC METABOLIC PANEL - Abnormal; Notable for the following components:   BUN 48 (*)    Creatinine, Ser 2.34 (*)    Calcium 8.6 (*)    GFR calc non Af Amer 19 (*)    GFR calc Af Amer 23 (*)    All other components within normal limits  CBC WITH DIFFERENTIAL/PLATELET - Abnormal; Notable for the following components:   RBC 3.86 (*)    All other components within normal limits  CULTURE, BLOOD (ROUTINE X 2)  CULTURE, BLOOD (ROUTINE X 2)  URINE CULTURE  LACTIC ACID, PLASMA  LACTIC ACID, PLASMA    EKG EKG Interpretation  Date/Time:  Monday September 20 2018 23:53:33 EST Ventricular Rate:  75 PR Interval:    QRS Duration: 98 QT Interval:  404 QTC Calculation: 452 R Axis:   -29 Text Interpretation:  Sinus rhythm Borderline left axis deviation Low voltage, precordial leads Abnormal R-wave progression, early transition No significant change since last tracing Confirmed by  Shaune Pollack [phone]) on 09/21/2018 1:11:07 AM   Radiology Ct Abdomen Pelvis Wo Contrast  Result Date: 09/21/2018 CLINICAL DATA:  Abdominal distention, nausea, and vomiting. EXAM: CT ABDOMEN AND PELVIS WITHOUT CONTRAST TECHNIQUE:  Multidetector CT imaging of the abdomen and pelvis was performed following the standard protocol without IV contrast. COMPARISON:  None. FINDINGS: Lower chest: Atelectasis in the lung bases. Moderate to large esophageal hiatal hernia. Hepatobiliary: Tiny stones layering in the gallbladder. No gallbladder wall thickening or edema. No bile duct dilatation. Subcentimeter low-attenuation lesion in the left lobe of the liver probably representing a small cyst. No other focal liver lesions. Pancreas: Unremarkable. No pancreatic ductal dilatation or surrounding inflammatory changes. Spleen: Normal in size without focal abnormality. Adrenals/Urinary Tract: No adrenal gland nodules. Kidneys are atrophic. Cysts on the right kidney. No hydronephrosis or hydroureter. No renal, ureteral, or bladder stones. Bladder is decompressed. Stomach/Bowel: Stomach, small bowel, and colon are mostly decompressed. No wall thickening or inflammatory changes are appreciated. Appendix is not identified. Scattered diverticula in the sigmoid colon without evidence of diverticulitis. Vascular/Lymphatic: Aortic atherosclerosis. No enlarged abdominal or pelvic lymph nodes. Reproductive: Status post hysterectomy. No adnexal masses. Other: No free air or free fluid in the abdomen. Surgical clips and scarring in the pelvis, likely postoperative. Minimal umbilical hernia containing fat. Musculoskeletal: Degenerative changes in the spine. No destructive bone lesions. IMPRESSION: 1. Moderate to large esophageal hiatal hernia. 2. Cholelithiasis without evidence of cholecystitis. 3. No evidence of bowel obstruction or inflammation. Aortic Atherosclerosis (ICD10-I70.0). Electronically Signed   By: Burman Nieves M.D.   On: 09/21/2018 03:19   Dg Chest 2 View  Result Date: 09/20/2018 CLINICAL DATA:  78 year old female with hypoxia, weakness. Recent foot surgery. EXAM: CHEST - 2 VIEW COMPARISON:  Chest radiographs 06/29/2013. FINDINGS: Semi upright AP and  lateral views of the chest. Right shoulder arthroplasty since 2014. No acute osseous abnormality identified. Paucity of bowel gas in the upper abdomen. Lung volumes and mediastinal contours remain within normal limits. No pneumothorax, pulmonary edema, pleural effusion or confluent pulmonary opacity. Visualized tracheal air column is within normal limits. IMPRESSION: No acute cardiopulmonary abnormality. Electronically Signed   By: Odessa Fleming M.D.   On: 09/20/2018 23:28    Procedures .Critical Care Performed by: Shaune Pollack, MD Authorized by: Shaune Pollack, MD   Critical care provider statement:    Critical care time (minutes):  35   Critical care time was exclusive of:  Separately billable procedures and treating other patients and teaching time   Critical care was necessary to treat or prevent imminent or life-threatening deterioration of the following conditions:  Circulatory failure, cardiac failure and sepsis   Critical care was time spent personally by me on the following activities:  Development of treatment plan with patient or surrogate, discussions with consultants, evaluation of patient's response to treatment, examination of patient, obtaining history from patient or surrogate, ordering and performing treatments and interventions, ordering and review of laboratory studies, ordering and review of radiographic studies, pulse oximetry, re-evaluation of patient's condition and review of old charts   I assumed direction of critical care for this patient from another provider in my specialty: no     (including critical care time)  Medications Ordered in ED Medications  sodium chloride flush (NS) 0.9 % injection 3 mL ( Intravenous Canceled Entry 09/21/18 0021)  acetaminophen (TYLENOL) tablet 650 mg (has no administration in time range)    Or  acetaminophen (TYLENOL) suppository 650 mg (has no administration in time range)  ondansetron (ZOFRAN) tablet 4 mg (has no administration in time  range)    Or  ondansetron (ZOFRAN) injection 4 mg (has no administration in time range)  0.9 %  sodium chloride infusion ( Intravenous New Bag/Given 09/21/18 0603)  hydrALAZINE (APRESOLINE) injection 5 mg (has no administration in time range)  morphine 2 MG/ML injection 1 mg (has no administration in time range)  sodium chloride 0.9 % bolus 1,000 mL (0 mLs Intravenous Stopped 09/21/18 0323)  sodium chloride 0.9 % bolus 1,000 mL (0 mLs Intravenous Stopped 09/21/18 0346)  ceFEPIme (MAXIPIME) 2 g in sodium chloride 0.9 % 100 mL IVPB (0 g Intravenous Stopped 09/21/18 0114)  vancomycin (VANCOCIN) 2,000 mg in sodium chloride 0.9 % 500 mL IVPB (0 mg Intravenous Stopped 09/21/18 0351)  lactated ringers bolus 1,000 mL (0 mLs Intravenous Stopped 09/21/18 0454)  technetium TC 33M mebrofenin (CHOLETEC) injection 5.5 millicurie (5.5 millicuries Intravenous Contrast Given 09/21/18 1015)     Initial Impression / Assessment and Plan / ED Course  I have reviewed the triage vital signs and the nursing notes.  Pertinent labs & imaging results that were available during my care of the patient were reviewed by me and considered in my medical decision making (see chart for details).  Clinical Course as of Sep 22 1015  Tue Sep 21, 2018  5932 78 year old female status post recent surgery on her right foot here with generalized fatigue, lightheadedness upon standing.  She is significantly dehydrated clinically with hypotension.  Lab work shows severe AKI, likely prerenal, and mild leukocytosis with lactic acid elevation.  Given these findings, code sepsis initiated though I suspect her symptoms are more so secondary to dehydration from poor p.o. intake after recent surgery.  She will need to be admitted.  Her abdomen is soft, nontender, and nondistended.  Will follow-up urinalysis.  If no apparent source of infection identified, will consider further imaging.  Chest x-ray is without pneumonia.   [CI]  0334 CT imaging, UA neg. I  suspect most of her sx are from profound dehydration. Admit to medicine.   [CI]    Clinical Course User Index [CI] Shaune Pollack, MD    **  Final Clinical Impressions(s) / ED Diagnoses   Final diagnoses:  AKI (acute kidney injury) (HCC)  Dehydration  SIRS (systemic inflammatory response syndrome) (HCC)  Lactic acidosis    ED Discharge Orders    None       Shaune Pollack, MD 09/21/18 1017

## 2018-09-20 NOTE — ED Notes (Addendum)
Date and time results received: 09/20/18 2357 (use smartphrase ".now" to insert current time)  Test: Lactic acid Critical Value:2.6  Name of Provider Notified: Isaacs md  Orders Received? Or Actions Taken?: waiting on orders

## 2018-09-20 NOTE — ED Notes (Signed)
Bed: BH41 Expected date:  Expected time:  Means of arrival:  Comments: 78 yo F/N/V

## 2018-09-20 NOTE — ED Triage Notes (Signed)
Pt BIB EMS from home with complaints of N/V and near syncopal episode since 6pm. Pt reports no pain. Pt reports surgery on foot on Tuesday and reports going to PCP to have foot rewrapped.   123/98  HR 89 94% RA RR 18 CBG 123  20G LAC  Zofran 4mg 

## 2018-09-21 ENCOUNTER — Emergency Department (HOSPITAL_COMMUNITY): Payer: Medicare Other

## 2018-09-21 ENCOUNTER — Encounter (HOSPITAL_COMMUNITY): Payer: Self-pay | Admitting: Internal Medicine

## 2018-09-21 ENCOUNTER — Observation Stay (HOSPITAL_COMMUNITY): Payer: Medicare Other

## 2018-09-21 DIAGNOSIS — R112 Nausea with vomiting, unspecified: Secondary | ICD-10-CM

## 2018-09-21 DIAGNOSIS — I10 Essential (primary) hypertension: Secondary | ICD-10-CM

## 2018-09-21 DIAGNOSIS — R197 Diarrhea, unspecified: Secondary | ICD-10-CM

## 2018-09-21 DIAGNOSIS — N179 Acute kidney failure, unspecified: Secondary | ICD-10-CM | POA: Diagnosis not present

## 2018-09-21 LAB — COMPREHENSIVE METABOLIC PANEL
ALK PHOS: 66 U/L (ref 38–126)
ALT: 19 U/L (ref 0–44)
ANION GAP: 13 (ref 5–15)
AST: 32 U/L (ref 15–41)
Albumin: 3.5 g/dL (ref 3.5–5.0)
BUN: 50 mg/dL — ABNORMAL HIGH (ref 8–23)
CALCIUM: 9.9 mg/dL (ref 8.9–10.3)
CO2: 26 mmol/L (ref 22–32)
Chloride: 100 mmol/L (ref 98–111)
Creatinine, Ser: 2.57 mg/dL — ABNORMAL HIGH (ref 0.44–1.00)
GFR calc Af Amer: 20 mL/min — ABNORMAL LOW (ref >60)
GFR calc non Af Amer: 17 mL/min — ABNORMAL LOW (ref >60)
Glucose, Bld: 133 mg/dL — ABNORMAL HIGH (ref 70–99)
Potassium: 3.9 mmol/L (ref 3.5–5.1)
Sodium: 139 mmol/L (ref 135–145)
Total Bilirubin: 0.5 mg/dL (ref 0.3–1.2)
Total Protein: 7.2 g/dL (ref 6.5–8.1)

## 2018-09-21 LAB — BASIC METABOLIC PANEL
Anion gap: 9 (ref 5–15)
BUN: 48 mg/dL — ABNORMAL HIGH (ref 8–23)
CO2: 23 mmol/L (ref 22–32)
Calcium: 8.6 mg/dL — ABNORMAL LOW (ref 8.9–10.3)
Chloride: 107 mmol/L (ref 98–111)
Creatinine, Ser: 2.34 mg/dL — ABNORMAL HIGH (ref 0.44–1.00)
GFR calc Af Amer: 23 mL/min — ABNORMAL LOW (ref >60)
GFR calc non Af Amer: 19 mL/min — ABNORMAL LOW (ref >60)
Glucose, Bld: 97 mg/dL (ref 70–99)
Potassium: 4.1 mmol/L (ref 3.5–5.1)
Sodium: 139 mmol/L (ref 135–145)

## 2018-09-21 LAB — URINALYSIS, ROUTINE W REFLEX MICROSCOPIC
BILIRUBIN URINE: NEGATIVE
Glucose, UA: NEGATIVE mg/dL
Hgb urine dipstick: NEGATIVE
Ketones, ur: NEGATIVE mg/dL
Leukocytes, UA: NEGATIVE
Nitrite: NEGATIVE
Protein, ur: 30 mg/dL — AB
Specific Gravity, Urine: 1.019 (ref 1.005–1.030)
pH: 5 (ref 5.0–8.0)

## 2018-09-21 LAB — HEPATIC FUNCTION PANEL
ALT: 18 U/L (ref 0–44)
AST: 26 U/L (ref 15–41)
Albumin: 3 g/dL — ABNORMAL LOW (ref 3.5–5.0)
Alkaline Phosphatase: 58 U/L (ref 38–126)
Bilirubin, Direct: 0.1 mg/dL (ref 0.0–0.2)
Indirect Bilirubin: 0.4 mg/dL (ref 0.3–0.9)
Total Bilirubin: 0.5 mg/dL (ref 0.3–1.2)
Total Protein: 6.2 g/dL — ABNORMAL LOW (ref 6.5–8.1)

## 2018-09-21 LAB — CBC WITH DIFFERENTIAL/PLATELET
Abs Immature Granulocytes: 0.04 10*3/uL (ref 0.00–0.07)
Basophils Absolute: 0 10*3/uL (ref 0.0–0.1)
Basophils Relative: 0 %
Eosinophils Absolute: 0.3 10*3/uL (ref 0.0–0.5)
Eosinophils Relative: 3 %
HCT: 37.7 % (ref 36.0–46.0)
Hemoglobin: 12 g/dL (ref 12.0–15.0)
Immature Granulocytes: 0 %
LYMPHS PCT: 13 %
Lymphs Abs: 1.2 10*3/uL (ref 0.7–4.0)
MCH: 31.1 pg (ref 26.0–34.0)
MCHC: 31.8 g/dL (ref 30.0–36.0)
MCV: 97.7 fL (ref 80.0–100.0)
Monocytes Absolute: 0.7 10*3/uL (ref 0.1–1.0)
Monocytes Relative: 7 %
Neutro Abs: 7.3 10*3/uL (ref 1.7–7.7)
Neutrophils Relative %: 77 %
Platelets: 278 10*3/uL (ref 150–400)
RBC: 3.86 MIL/uL — ABNORMAL LOW (ref 3.87–5.11)
RDW: 12.5 % (ref 11.5–15.5)
WBC: 9.5 10*3/uL (ref 4.0–10.5)
nRBC: 0 % (ref 0.0–0.2)

## 2018-09-21 LAB — LACTIC ACID, PLASMA
Lactic Acid, Venous: 1.8 mmol/L (ref 0.5–1.9)
Lactic Acid, Venous: 2 mmol/L (ref 0.5–1.9)

## 2018-09-21 MED ORDER — HYDRALAZINE HCL 20 MG/ML IJ SOLN: 5.0000 mg | INTRAMUSCULAR | Status: DC | PRN

## 2018-09-21 MED ORDER — METRONIDAZOLE IN NACL 5-0.79 MG/ML-% IV SOLN
500.0000 mg | Freq: Three times a day (TID) | INTRAVENOUS | Status: DC
  Administered 2018-09-21: 500 mg via INTRAVENOUS
  Filled 2018-09-21: qty 100

## 2018-09-21 MED ORDER — CLONAZEPAM 0.5 MG PO TABS
0.5000 mg | ORAL_TABLET | Freq: Two times a day (BID) | ORAL | Status: DC | PRN
  Administered 2018-09-23: 0.5 mg via ORAL
  Filled 2018-09-21: qty 1

## 2018-09-21 MED ORDER — GABAPENTIN 300 MG PO CAPS
600.0000 mg | ORAL_CAPSULE | Freq: Two times a day (BID) | ORAL | Status: DC
  Administered 2018-09-21 – 2018-09-23 (×4): 600 mg via ORAL
  Filled 2018-09-21 (×4): qty 2

## 2018-09-21 MED ORDER — MORPHINE SULFATE (PF) 2 MG/ML IV SOLN
1.0000 mg | INTRAVENOUS | Status: DC | PRN
  Administered 2018-09-21 – 2018-09-22 (×4): 1 mg via INTRAVENOUS
  Filled 2018-09-21 (×4): qty 1

## 2018-09-21 MED ORDER — SODIUM CHLORIDE 0.9 % IV SOLN
INTRAVENOUS | Status: AC
  Administered 2018-09-21 – 2018-09-22 (×3): via INTRAVENOUS

## 2018-09-21 MED ORDER — ONDANSETRON HCL 4 MG PO TABS: 4.0000 mg | ORAL_TABLET | Freq: Four times a day (QID) | ORAL | Status: DC | PRN

## 2018-09-21 MED ORDER — LACTATED RINGERS IV BOLUS
1000.0000 mL | Freq: Once | INTRAVENOUS | Status: AC
  Administered 2018-09-21: 1000 mL via INTRAVENOUS

## 2018-09-21 MED ORDER — TECHNETIUM TC 99M MEBROFENIN IV KIT
5.5000 | PACK | Freq: Once | INTRAVENOUS | Status: AC
  Administered 2018-09-21: 5.5 via INTRAVENOUS

## 2018-09-21 MED ORDER — VANCOMYCIN HCL IN DEXTROSE 1-5 GM/200ML-% IV SOLN: 1000.0000 mg | Freq: Once | INTRAVENOUS | Status: DC

## 2018-09-21 MED ORDER — ACETAMINOPHEN 325 MG PO TABS
650.0000 mg | ORAL_TABLET | Freq: Four times a day (QID) | ORAL | Status: DC | PRN
  Administered 2018-09-21 – 2018-09-23 (×4): 650 mg via ORAL
  Filled 2018-09-21 (×4): qty 2

## 2018-09-21 MED ORDER — VANCOMYCIN HCL 10 G IV SOLR
2000.0000 mg | Freq: Once | INTRAVENOUS | Status: AC
  Administered 2018-09-21: 2000 mg via INTRAVENOUS
  Filled 2018-09-21: qty 2000

## 2018-09-21 MED ORDER — ONDANSETRON HCL 4 MG/2ML IJ SOLN: 4.0000 mg | Freq: Four times a day (QID) | INTRAMUSCULAR | Status: DC | PRN

## 2018-09-21 MED ORDER — SODIUM CHLORIDE 0.9 % IV SOLN
2.0000 g | Freq: Once | INTRAVENOUS | Status: AC
  Administered 2018-09-21: 2 g via INTRAVENOUS
  Filled 2018-09-21: qty 2

## 2018-09-21 MED ORDER — ACETAMINOPHEN 650 MG RE SUPP: 650.0000 mg | Freq: Four times a day (QID) | RECTAL | Status: DC | PRN

## 2018-09-21 NOTE — ED Notes (Signed)
Daughter : Loann Quill [phone]

## 2018-09-21 NOTE — ED Notes (Signed)
Family at bedside. 

## 2018-09-21 NOTE — ED Notes (Signed)
HOSPITALIST Provider at bedside. 

## 2018-09-21 NOTE — Progress Notes (Signed)
A consult was received from an ED physician for cefepime and vancomycin per pharmacy dosing.  The patient's profile has been reviewed for ht/wt/allergies/indication/available labs.   A one time order has been placed for Cefepime 2 Gm and Vancomycin 2 Gm.  Further antibiotics/pharmacy consults should be ordered by admitting physician if indicated.                       Thank you, Lorenza Evangelist 09/21/2018  12:18 AM

## 2018-09-21 NOTE — ED Notes (Signed)
Unable to get a full set (red and blue) x 2, hard stick. Able to get to blue top blood culture tubes from two different sites on there right forearm. Dr. Marcello Moores is aware of the difficulty of obtaining the blood samples and another IV. He requested to place an IV consult, which this Clinical research associate. Also, will provide patient ice water with his permission.

## 2018-09-21 NOTE — H&P (Signed)
History and Physical    Maria Roanatricia A Deshong ZOX:[id number] DOB: 07/25/1941 DOA: 09/20/2018  PCP: Samuel JesterButler, Cynthia, DO  Patient coming from: Home.  Chief Complaint: Nausea vomiting diarrhea and almost passed out.  HPI: Maria George is a 78 y.o. female with history of hypertension, depression who had a bunionectomy of the right foot on September 14, 2018 and had gone to orthopedic office yesterday for rewrapping her foot because it got wet started having nausea vomiting and diarrhea an hour after having chicken and milk at a fast food center.  Has been having multiple episodes of vomiting and diarrhea nonbloody.  Crampy abdominal pain.  Patient feels weak and almost passed out.  Denies any chest pain or shortness of breath.  Did not have any fever or chills.  ED Course: In the ER patient was hypotensive with blood pressure systolic in the 60s.  Lactate is mildly elevated.  Initially code sepsis was initiated and started on empiric antibiotics blood cultures urine cultures by obtained.  Patient was given fluid bolus for sepsis following which blood pressure improved.  Chest x-ray UA was unremarkable.  CT abdomen pelvis showed cholelithiasis.  On exam patient has diffuse abdominal tenderness with no rigidity or rebound tenderness.  Patient WBC count is 11.6 creatinine is around 2.57 the last 1 in our system was 1.5 about 6 years ago.  EKG shows normal sinus rhythm.  Patient admitted for acute renal failure with nausea vomiting diarrhea and near syncope.  Review of Systems: As per HPI, rest all negative.   Past Medical History:  Diagnosis Date  . Arthritis   . Depression   . GERD (gastroesophageal reflux disease)    otc  . Hypertension   . Migraine    has been yrs.  . Osteoporosis     Past Surgical History:  Procedure Laterality Date  . ABDOMINAL HYSTERECTOMY  1974  . arthrscopy bilateral shoulder  yrs ago   right x 1, left x 1  . bilateral rotator cuff repair  2003  . breast biopsy  bilateral  yrs ago  . CERVICAL LAMINECTOMY  1980's  . EYE SURGERY    . JOINT REPLACEMENT  04/2012   Left Knee  . KNEE ARTHROSCOPY  jan 2012   bilateral knees  . lower back surgery  1990's  . rectocle and cystocele repair  late 1980's  . REVERSE SHOULDER ARTHROPLASTY Right 07/08/2013   Procedure: REVERSE RIGHT SHOULDER ARTHROPLASTY;  Surgeon: Verlee RossettiSteven R Norris, MD;  [facility];  Service: Orthopedics;  Laterality: Right;  . TONSILLECTOMY  1970's  . TOTAL KNEE ARTHROPLASTY  sept 2012  . TOTAL KNEE ARTHROPLASTY  09/17/2011   Procedure: TOTAL KNEE ARTHROPLASTY;  Surgeon: Loanne DrillingFrank V Aluisio, MD;  Location: [facility] ORS;  Service: Orthopedics;  Laterality: Right;     reports that she quit smoking about 22 years ago. She has a 2.50 pack-year smoking history. She has never used smokeless tobacco. She reports that she does not drink alcohol or use drugs.  Allergies  Allergen Reactions  . Peanut-Containing Drug Products Swelling    Family History  Problem Relation Age of Onset  . Cancer Mother        Breast CA  . Heart attack Father   . Cancer Maternal Grandmother        Breast CA    Prior to Admission medications   Medication Sig Start Date End Date Taking? Authorizing Provider  BLACK COHOSH EXTRACT PO Take 1 tablet by mouth daily.   Yes  [provider]  clonazePAM (KLONOPIN) 0.5 MG tablet Take 0.5 mg by mouth 2 (two) times daily as needed for anxiety.   Yes [provider]  cyclobenzaprine (FLEXERIL) 10 MG tablet Take 10 mg by mouth 3 (three) times daily as needed for muscle spasms.   Yes [provider]  gabapentin (NEURONTIN) 300 MG capsule Take 600 mg by mouth 2 (two) times daily.  06/13/14  Yes [provider]  hydrOXYzine (ATARAX/VISTARIL) 25 MG tablet Take 25 mg by mouth 3 (three) times daily as needed for itching.   Yes [provider]  lisinopril-hydrochlorothiazide (PRINZIDE,ZESTORETIC) 20-25 MG per tablet Take 1 tablet by mouth daily  before breakfast.   Yes [provider]  naproxen sodium (ALEVE) 220 MG tablet Take 220 mg by mouth 2 (two) times daily as needed (pain).   Yes [provider]  oxyCODONE (OXY IR/ROXICODONE) 5 MG immediate release tablet Take 5 mg by mouth every 6 (six) hours as needed for severe pain.  09/14/18  Yes [provider]  oxyCODONE-acetaminophen (PERCOCET) 10-325 MG per tablet Take 1 tablet by mouth every 6 (six) hours as needed for pain.  06/16/14  Yes [provider]  Polyethyl Glycol-Propyl Glycol (SYSTANE) 0.4-0.3 % SOLN Apply 1 drop to eye 3 (three) times daily as needed (dry eyes).   Yes [provider]  venlafaxine (EFFEXOR-XR) 150 MG 24 hr capsule Take 150 mg by mouth daily with breakfast.    Yes [provider]  Vitamin D, Ergocalciferol, (DRISDOL) 1.25 MG (50000 UT) CAPS capsule Take 50,000 Units by mouth 2 (two) times a week. Monday and Thursday   Yes [provider]  zolpidem (AMBIEN) 10 MG tablet Take 10 mg by mouth at bedtime as needed for sleep.    Yes [provider]  HYDROcodone-acetaminophen (NORCO/VICODIN) 5-325 MG tablet Take 1 tablet by mouth every 6 (six) hours as needed. Patient not taking: Reported on 09/21/2018 12/22/15   Derwood Kaplan, MD  HYDROcodone-acetaminophen (NORCO/VICODIN) 5-325 MG tablet Take 1 tablet by mouth every 6 (six) hours as needed. Patient not taking: Reported on 09/21/2018 12/22/15   Derwood Kaplan, MD    Physical Exam: Vitals:   09/21/18 0400 09/21/18 0430 09/21/18 0500 09/21/18 0530  BP: 122/73 121/84 112/65 117/70  Pulse: 85 86 86 84  Resp: 14 13 14 15   Temp:      TempSrc:      SpO2: 92% 96% 94% 95%      Constitutional: Moderately built and nourished. Vitals:   09/21/18 0400 09/21/18 0430 09/21/18 0500 09/21/18 0530  BP: 122/73 121/84 112/65 117/70  Pulse: 85 86 86 84  Resp: 14 13 14 15   Temp:      TempSrc:      SpO2: 92% 96% 94% 95%   Eyes: Anicteric no pallor. ENMT: No  discharge from the ears eyes nose and mouth. Neck: No mass felt.  No neck rigidity. Respiratory: No rhonchi or crepitations. Cardiovascular: S1-S2 heard. Abdomen: Tenderness in the abdomen diffusely no rebound tenderness or rigidity.  Bowel sounds present. Musculoskeletal: Right foot is in a dressing. Skin: No obvious rash. Neurologic: Alert awake oriented to time place and person.  Moves all extremities. Psychiatric: Appears normal.  Normal affect.   Labs on Admission: I have personally reviewed following labs and imaging studies  CBC: Recent Labs  Lab 09/20/18 2256  WBC 11.9*  NEUTROABS 9.6*  HGB 13.7  HCT 42.9  MCV 96.8  PLT 362   Basic Metabolic Panel: Recent  Labs  Lab 09/20/18 2256  NA 139  K 3.9  CL 100  CO2 26  GLUCOSE 133*  BUN 50*  CREATININE 2.57*  CALCIUM 9.9   GFR: CrCl cannot be calculated (Unknown ideal weight.). Liver Function Tests: Recent Labs  Lab 09/20/18 2256  AST 32  ALT 19  ALKPHOS 66  BILITOT 0.5  PROT 7.2  ALBUMIN 3.5   No results for input(s): LIPASE, AMYLASE in the last 168 hours. No results for input(s): AMMONIA in the last 168 hours. Coagulation Profile: No results for input(s): INR, PROTIME in the last 168 hours. Cardiac Enzymes: No results for input(s): CKTOTAL, CKMB, CKMBINDEX, TROPONINI in the last 168 hours. BNP (last 3 results) No results for input(s): PROBNP in the last 8760 hours. HbA1C: No results for input(s): HGBA1C in the last 72 hours. CBG: No results for input(s): GLUCAP in the last 168 hours. Lipid Profile: No results for input(s): CHOL, HDL, LDLCALC, TRIG, CHOLHDL, LDLDIRECT in the last 72 hours. Thyroid Function Tests: No results for input(s): TSH, T4TOTAL, FREET4, T3FREE, THYROIDAB in the last 72 hours. Anemia Panel: No results for input(s): VITAMINB12, FOLATE, FERRITIN, TIBC, IRON, RETICCTPCT in the last 72 hours. Urine analysis:    Component Value Date/Time   COLORURINE AMBER (A) 09/21/2018 0142    APPEARANCEUR HAZY (A) 09/21/2018 0142   LABSPEC 1.019 09/21/2018 0142   PHURINE 5.0 09/21/2018 0142   GLUCOSEU NEGATIVE 09/21/2018 0142   HGBUR NEGATIVE 09/21/2018 0142   BILIRUBINUR NEGATIVE 09/21/2018 0142   KETONESUR NEGATIVE 09/21/2018 0142   PROTEINUR 30 (A) 09/21/2018 0142   UROBILINOGEN 1.0 07/11/2013 1751   NITRITE NEGATIVE 09/21/2018 0142   LEUKOCYTESUR NEGATIVE 09/21/2018 0142   Sepsis Labs: @LABRCNTIP (procalcitonin:4,lacticidven:4) )No results found for this or any previous visit (from the past 240 hour(s)).   Radiological Exams on Admission: Ct Abdomen Pelvis Wo Contrast  Result Date: 09/21/2018 CLINICAL DATA:  Abdominal distention, nausea, and vomiting. EXAM: CT ABDOMEN AND PELVIS WITHOUT CONTRAST TECHNIQUE: Multidetector CT imaging of the abdomen and pelvis was performed following the standard protocol without IV contrast. COMPARISON:  None. FINDINGS: Lower chest: Atelectasis in the lung bases. Moderate to large esophageal hiatal hernia. Hepatobiliary: Tiny stones layering in the gallbladder. No gallbladder wall thickening or edema. No bile duct dilatation. Subcentimeter low-attenuation lesion in the left lobe of the liver probably representing a small cyst. No other focal liver lesions. Pancreas: Unremarkable. No pancreatic ductal dilatation or surrounding inflammatory changes. Spleen: Normal in size without focal abnormality. Adrenals/Urinary Tract: No adrenal gland nodules. Kidneys are atrophic. Cysts on the right kidney. No hydronephrosis or hydroureter. No renal, ureteral, or bladder stones. Bladder is decompressed. Stomach/Bowel: Stomach, small bowel, and colon are mostly decompressed. No wall thickening or inflammatory changes are appreciated. Appendix is not identified. Scattered diverticula in the sigmoid colon without evidence of diverticulitis. Vascular/Lymphatic: Aortic atherosclerosis. No enlarged abdominal or pelvic lymph nodes. Reproductive: Status post hysterectomy. No  adnexal masses. Other: No free air or free fluid in the abdomen. Surgical clips and scarring in the pelvis, likely postoperative. Minimal umbilical hernia containing fat. Musculoskeletal: Degenerative changes in the spine. No destructive bone lesions. IMPRESSION: 1. Moderate to large esophageal hiatal hernia. 2. Cholelithiasis without evidence of cholecystitis. 3. No evidence of bowel obstruction or inflammation. Aortic Atherosclerosis (ICD10-I70.0). Electronically Signed   By: Burman Nieves M.D.   On: 09/21/2018 03:19   Dg Chest 2 View  Result Date: 09/20/2018 CLINICAL DATA:  78 year old female with hypoxia, weakness. Recent foot surgery. EXAM: CHEST - 2  VIEW COMPARISON:  Chest radiographs 06/29/2013. FINDINGS: Semi upright AP and lateral views of the chest. Right shoulder arthroplasty since 2014. No acute osseous abnormality identified. Paucity of bowel gas in the upper abdomen. Lung volumes and mediastinal contours remain within normal limits. No pneumothorax, pulmonary edema, pleural effusion or confluent pulmonary opacity. Visualized tracheal air column is within normal limits. IMPRESSION: No acute cardiopulmonary abnormality. Electronically Signed   By: Odessa Fleming M.D.   On: 09/20/2018 23:28    EKG: Independently reviewed.  Normal sinus rhythm.  Assessment/Plan Principal Problem:   ARF (acute renal failure) (HCC) Active Problems:   Essential hypertension   Nausea vomiting and diarrhea   Acute renal failure (ARF) (HCC)    1. Acute renal failure prerenal likely from nausea vomiting and diarrhea which I think will improve with hydration and holding of patient's antihypertensive including lisinopril hydrochlorothiazide and avoid NSAIDs. 2. Nausea vomiting and diarrhea likely could be gastroenteritis.  However since CAT scan does show cholelithiasis and and patient has some abdominal tenderness but is diffuse we will check HIDA scan.  Follow LFTs.  Presently n.p.o. until HIDA scan results are  available. 3. Near syncopal episode likely from hypotension from dehydration.  Presently improving.  Closely monitor in telemetry. 4. Hypertension was hypotensive will keep patient on PRN IV Dralzine closely follow blood pressure trends. 5. History of depression and anxiety presently n.p.o.  Start home medications soon after HIDA scan if negative. 6. Recent right foot bunionectomy.   DVT prophylaxis: SCDs until we get HIDA scan results. Code Status: Full code. Family Communication: Discussed with patient. Disposition Plan: Home. Consults called: None. Admission status: Observation.   Eduard Clos MD [facility] Pager [phone].  If 7PM-7AM, please contact night-coverage [email or web address] Password [facility]  09/21/2018, 5:54 AM

## 2018-09-21 NOTE — Plan of Care (Signed)
78 year old female admitted by my partner this morning with history of hypertension depression recent bunionectomy September 14, 2018 admitted with intractable nausea vomiting and diarrhea.  She was found to be hypotensive and with AKI secondary to dehydration with CT of the abdomen showing gallstones.  She is pending HIDA scan.

## 2018-09-21 NOTE — ED Notes (Signed)
PT REMAINS IN [facility] PERFORMING HEPATO SCAN. DAUGHTER CALLED AND UPDATED BY LISA UNIT SECT.

## 2018-09-21 NOTE — Progress Notes (Signed)
MD made aware of Lactic Acid 2.0.

## 2018-09-21 NOTE — ED Notes (Signed)
Patient transported to [facility] HEPATO.

## 2018-09-21 NOTE — ED Notes (Signed)
In the meanwhile of obtaining blood samples and starting orders, patient attempted to obtain a urine sample via bed pan but was unable too.

## 2018-09-21 NOTE — ED Notes (Signed)
Per scan keep patient NPO and no pain medication until after test is done

## 2018-09-21 NOTE — ED Notes (Signed)
ED TO INPATIENT HANDOFF REPORT  Name/Age/Gender Maria George 78 y.o. female  Code Status    Code Status Orders  (From admission, onward)         Start     Ordered   09/21/18 0551  Full code  Continuous     09/21/18 0552        Code Status History    Date Active Date Inactive Code Status Order ID Comments User Context   07/08/2013 1758 07/12/2013 1812 Full Code [id number]  [name], MD Inpatient   09/17/2011 1603 09/20/2011 1646 Full Code [id number]  [name], RN Inpatient      Home/SNF/Other Home  Chief Complaint Emesis;Near Syncope  Level of Care/Admitting Diagnosis ED Disposition    ED Disposition Condition Comment   Admit  Hospital Area: [facility] [[id number]]  Level of Care: Telemetry [5]  Admit to tele based on following criteria: Monitor for Ischemic changes  Diagnosis: Acute renal failure (ARF) [facility]) [[id number]]  Admitting Physician: Eduard Clos [phone]  Attending Physician: Eduard Clos Florian.Pax  PT Class (Do Not Modify): Observation [104]  PT Acc Code (Do Not Modify): Observation [10022]       Medical History Past Medical History:  Diagnosis Date  . Arthritis   . Depression   . GERD (gastroesophageal reflux disease)    otc  . Hypertension   . Migraine    has been yrs.  . Osteoporosis     Allergies Allergies  Allergen Reactions  . Peanut-Containing Drug Products Swelling    IV Location/Drains/Wounds Patient Lines/Drains/Airways Status   Active Line/Drains/Airways    Name:   Placement date:   Placement time:   Site:   Days:   Peripheral IV 09/20/18 Left Antecubital   09/20/18    2308    Antecubital   1   Peripheral IV 09/21/18 Right Antecubital   09/21/18    0121    Antecubital   less than 1   Incision 09/17/11 Knee Right   09/17/11    1336     2561   Incision 07/08/13 Shoulder Right   07/08/13    1558     1901          Labs/Imaging Results for orders placed or performed during the hospital  encounter of 09/20/18 (from the past 48 hour(s))  Lactic acid, plasma     Status: Abnormal   Collection Time: 09/20/18 10:56 PM  Result Value Ref Range   Lactic Acid, Venous 2.6 (HH) 0.5 - 1.9 mmol/L    Comment: CRITICAL RESULT CALLED TO, READ BACK BY AND VERIFIED WITH: RN NASH J. AT 2358 09/20/18 CRUICKSHANK A Performed at [facility], [address], [place], [place] [address]   Comprehensive metabolic panel     Status: Abnormal   Collection Time: 09/20/18 10:56 PM  Result Value Ref Range   Sodium 139 135 - 145 mmol/L   Potassium 3.9 3.5 - 5.1 mmol/L   Chloride 100 98 - 111 mmol/L   CO2 26 22 - 32 mmol/L   Glucose, Bld 133 (H) 70 - 99 mg/dL   BUN 50 (H) 8 - 23 mg/dL   Creatinine, Ser 3.87 (H) 0.44 - 1.00 mg/dL   Calcium 9.9 8.9 - 56.4 mg/dL   Total Protein 7.2 6.5 - 8.1 g/dL   Albumin 3.5 3.5 - 5.0 g/dL   AST 32 15 - 41 U/L   ALT 19 0 - 44 U/L   Alkaline  Phosphatase 66 38 - 126 U/L   Total Bilirubin 0.5 0.3 - 1.2 mg/dL   GFR calc non Af Amer 17 (L) >60 mL/min   GFR calc Af Amer 20 (L) >60 mL/min   Anion gap 13 5 - 15    Comment: Performed at [facility], [address] [place], [place] [address]  CBC with Differential     Status: Abnormal   Collection Time: 09/20/18 10:56 PM  Result Value Ref Range   WBC 11.9 (H) 4.0 - 10.5 K/uL   RBC 4.43 3.87 - 5.11 MIL/uL   Hemoglobin 13.7 12.0 - 15.0 g/dL   HCT 19.142.9 47.836.0 - 29.546.0 %   MCV 96.8 80.0 - 100.0 fL   MCH 30.9 26.0 - 34.0 pg   MCHC 31.9 30.0 - 36.0 g/dL   RDW 62.112.5 30.811.5 - 65.715.5 %   Platelets 362 150 - 400 K/uL   nRBC 0.0 0.0 - 0.2 %   Neutrophils Relative % 80 %   Neutro Abs 9.6 (H) 1.7 - 7.7 K/uL   Lymphocytes Relative 9 %   Lymphs Abs 1.0 0.7 - 4.0 K/uL   Monocytes Relative 8 %   Monocytes Absolute 0.9 0.1 - 1.0 K/uL   Eosinophils Relative 2 %   Eosinophils Absolute 0.3 0.0 - 0.5 K/uL   Basophils Relative 1 %   Basophils Absolute 0.1 0.0 - 0.1 K/uL   Immature Granulocytes 0 %    Abs Immature Granulocytes 0.04 0.00 - 0.07 K/uL    Comment: Performed at [facility], [address] [place], [place] [address]  Urinalysis, Routine w reflex microscopic     Status: Abnormal   Collection Time: 09/21/18  1:42 AM  Result Value Ref Range   Color, Urine AMBER (A) YELLOW    Comment: BIOCHEMICALS MAY BE AFFECTED BY COLOR   APPearance HAZY (A) CLEAR   Specific Gravity, Urine 1.019 1.005 - 1.030   pH 5.0 5.0 - 8.0   Glucose, UA NEGATIVE NEGATIVE mg/dL   Hgb urine dipstick NEGATIVE NEGATIVE   Bilirubin Urine NEGATIVE NEGATIVE   Ketones, ur NEGATIVE NEGATIVE mg/dL   Protein, ur 30 (A) NEGATIVE mg/dL   Nitrite NEGATIVE NEGATIVE   Leukocytes, UA NEGATIVE NEGATIVE   RBC / HPF 0-5 0 - 5 RBC/hpf   WBC, UA 0-5 0 - 5 WBC/hpf   Bacteria, UA RARE (A) NONE SEEN   Squamous Epithelial / LPF 0-5 0 - 5   Mucus PRESENT    Hyaline Casts, UA PRESENT     Comment: Performed at [facility], [address] [place], [place] [address]  Hepatic function panel     Status: Abnormal   Collection Time: 09/21/18  6:08 AM  Result Value Ref Range   Total Protein 6.2 (L) 6.5 - 8.1 g/dL   Albumin 3.0 (L) 3.5 - 5.0 g/dL   AST 26 15 - 41 U/L   ALT 18 0 - 44 U/L   Alkaline Phosphatase 58 38 - 126 U/L   Total Bilirubin 0.5 0.3 - 1.2 mg/dL   Bilirubin, Direct 0.1 0.0 - 0.2 mg/dL   Indirect Bilirubin 0.4 0.3 - 0.9 mg/dL    Comment: Performed at [facility], [address] [place], [place] [address]  Basic metabolic panel     Status: Abnormal   Collection Time: 09/21/18  6:08 AM  Result Value Ref Range   Sodium 139 135 - 145 mmol/L   Potassium 4.1 3.5 - 5.1  mmol/L   Chloride 107 98 - 111 mmol/L   CO2 23 22 - 32 mmol/L   Glucose, Bld 97 70 - 99 mg/dL   BUN 48 (H) 8 - 23 mg/dL   Creatinine, Ser 1.612.34 (H) 0.44 - 1.00 mg/dL   Calcium 8.6 (L) 8.9 - 10.3 mg/dL   GFR calc non Af Amer 19 (L) >60 mL/min   GFR calc Af Amer 23 (L) >60 mL/min   Anion  gap 9 5 - 15    Comment: Performed at [facility], [address] [place], [place] [address]  CBC WITH DIFFERENTIAL     Status: Abnormal   Collection Time: 09/21/18  6:08 AM  Result Value Ref Range   WBC 9.5 4.0 - 10.5 K/uL   RBC 3.86 (L) 3.87 - 5.11 MIL/uL   Hemoglobin 12.0 12.0 - 15.0 g/dL   HCT 54.037.7 98.136.0 - 19.146.0 %   MCV 97.7 80.0 - 100.0 fL   MCH 31.1 26.0 - 34.0 pg   MCHC 31.8 30.0 - 36.0 g/dL   RDW 47.812.5 29.511.5 - 62.115.5 %   Platelets 278 150 - 400 K/uL   nRBC 0.0 0.0 - 0.2 %   Neutrophils Relative % 77 %   Neutro Abs 7.3 1.7 - 7.7 K/uL   Lymphocytes Relative 13 %   Lymphs Abs 1.2 0.7 - 4.0 K/uL   Monocytes Relative 7 %   Monocytes Absolute 0.7 0.1 - 1.0 K/uL   Eosinophils Relative 3 %   Eosinophils Absolute 0.3 0.0 - 0.5 K/uL   Basophils Relative 0 %   Basophils Absolute 0.0 0.0 - 0.1 K/uL   Immature Granulocytes 0 %   Abs Immature Granulocytes 0.04 0.00 - 0.07 K/uL    Comment: Performed at [facility], [address] [place], [place] [address]  Lactic acid, plasma     Status: None   Collection Time: 09/21/18  6:09 AM  Result Value Ref Range   Lactic Acid, Venous 1.8 0.5 - 1.9 mmol/L    Comment: Performed at [facility], [address] [place], [place] [address]   Ct Abdomen Pelvis Wo Contrast  Result Date: 09/21/2018 CLINICAL DATA:  Abdominal distention, nausea, and vomiting. EXAM: CT ABDOMEN AND PELVIS WITHOUT CONTRAST TECHNIQUE: Multidetector CT imaging of the abdomen and pelvis was performed following the standard protocol without IV contrast. COMPARISON:  None. FINDINGS: Lower chest: Atelectasis in the lung bases. Moderate to large esophageal hiatal hernia. Hepatobiliary: Tiny stones layering in the gallbladder. No gallbladder wall thickening or edema. No bile duct dilatation. Subcentimeter low-attenuation lesion in the left lobe of the liver probably representing a small cyst. No other focal liver lesions.  Pancreas: Unremarkable. No pancreatic ductal dilatation or surrounding inflammatory changes. Spleen: Normal in size without focal abnormality. Adrenals/Urinary Tract: No adrenal gland nodules. Kidneys are atrophic. Cysts on the right kidney. No hydronephrosis or hydroureter. No renal, ureteral, or bladder stones. Bladder is decompressed. Stomach/Bowel: Stomach, small bowel, and colon are mostly decompressed. No wall thickening or inflammatory changes are appreciated. Appendix is not identified. Scattered diverticula in the sigmoid colon without evidence of diverticulitis. Vascular/Lymphatic: Aortic atherosclerosis. No enlarged abdominal or pelvic lymph nodes. Reproductive: Status post hysterectomy. No adnexal masses. Other: No free air or free fluid in the abdomen. Surgical clips and scarring in the pelvis, likely postoperative. Minimal umbilical hernia containing fat. Musculoskeletal: Degenerative changes in the spine. No destructive bone lesions. IMPRESSION: 1. Moderate to large esophageal hiatal hernia. 2. Cholelithiasis without  evidence of cholecystitis. 3. No evidence of bowel obstruction or inflammation. Aortic Atherosclerosis (ICD10-I70.0). Electronically Signed   By: Burman Nieves M.D.   On: 09/21/2018 03:19   Dg Chest 2 View  Result Date: 09/20/2018 CLINICAL DATA:  78 year old female with hypoxia, weakness. Recent foot surgery. EXAM: CHEST - 2 VIEW COMPARISON:  Chest radiographs 06/29/2013. FINDINGS: Semi upright AP and lateral views of the chest. Right shoulder arthroplasty since 2014. No acute osseous abnormality identified. Paucity of bowel gas in the upper abdomen. Lung volumes and mediastinal contours remain within normal limits. No pneumothorax, pulmonary edema, pleural effusion or confluent pulmonary opacity. Visualized tracheal air column is within normal limits. IMPRESSION: No acute cardiopulmonary abnormality. Electronically Signed   By: Odessa Fleming M.D.   On: 09/20/2018 23:28   EKG  Interpretation  Date/Time:  Monday September 20 2018 23:53:33 EST Ventricular Rate:  75 PR Interval:    QRS Duration: 98 QT Interval:  404 QTC Calculation: 452 R Axis:   -29 Text Interpretation:  Sinus rhythm Borderline left axis deviation Low voltage, precordial leads Abnormal R-wave progression, early transition No significant change since last tracing Confirmed by Shaune Pollack [phone]) on 09/21/2018 1:11:07 AM   Pending Labs Unresulted Labs (From admission, onward)    Start     Ordered   09/22/18 0500  CBC  Daily,   R     09/21/18 1229   09/22/18 0500  Comprehensive metabolic panel  Daily,   R     09/21/18 1229   09/21/18 0551  Lactic acid, plasma  STAT Now then every 3 hours,   R     09/21/18 0552   09/20/18 2316  Blood culture (routine x 2)  BLOOD CULTURE X 2,   STAT    Question:  Patient immune status  Answer:  Normal   09/20/18 2315   09/20/18 2316  Urine culture  ONCE - STAT,   STAT    Question:  Patient immune status  Answer:  Normal   09/20/18 2315          Vitals/Pain Today's Vitals   09/21/18 0830 09/21/18 0848 09/21/18 0930 09/21/18 0944  BP: 110/77  117/64   Pulse: 89  92 88  Resp: 14  20 16   Temp:      TempSrc:      SpO2: 97%  95% 95%  Weight:      Height:      PainSc:  0-No pain      Isolation Precautions No active isolations  Medications Medications  sodium chloride flush (NS) 0.9 % injection 3 mL ( Intravenous Canceled Entry 09/21/18 0021)  acetaminophen (TYLENOL) tablet 650 mg (has no administration in time range)    Or  acetaminophen (TYLENOL) suppository 650 mg (has no administration in time range)  ondansetron (ZOFRAN) tablet 4 mg (has no administration in time range)    Or  ondansetron (ZOFRAN) injection 4 mg (has no administration in time range)  0.9 %  sodium chloride infusion ( Intravenous New Bag/Given 09/21/18 0603)  hydrALAZINE (APRESOLINE) injection 5 mg (has no administration in time range)  morphine 2 MG/ML injection 1 mg (has no  administration in time range)  sodium chloride 0.9 % bolus 1,000 mL (0 mLs Intravenous Stopped 09/21/18 0323)  sodium chloride 0.9 % bolus 1,000 mL (0 mLs Intravenous Stopped 09/21/18 0346)  ceFEPIme (MAXIPIME) 2 g in sodium chloride 0.9 % 100 mL IVPB (0 g Intravenous Stopped 09/21/18 0114)  vancomycin (VANCOCIN) 2,000 mg in sodium chloride  0.9 % 500 mL IVPB (0 mg Intravenous Stopped 09/21/18 0351)  lactated ringers bolus 1,000 mL (0 mLs Intravenous Stopped 09/21/18 0454)  technetium TC 53M mebrofenin (CHOLETEC) injection 5.5 millicurie (5.5 millicuries Intravenous Contrast Given 09/21/18 1015)    Mobility walks with device

## 2018-09-22 DIAGNOSIS — Z8249 Family history of ischemic heart disease and other diseases of the circulatory system: Secondary | ICD-10-CM | POA: Diagnosis not present

## 2018-09-22 DIAGNOSIS — N179 Acute kidney failure, unspecified: Secondary | ICD-10-CM | POA: Diagnosis present

## 2018-09-22 DIAGNOSIS — R197 Diarrhea, unspecified: Secondary | ICD-10-CM | POA: Diagnosis present

## 2018-09-22 DIAGNOSIS — F419 Anxiety disorder, unspecified: Secondary | ICD-10-CM | POA: Diagnosis present

## 2018-09-22 DIAGNOSIS — E860 Dehydration: Secondary | ICD-10-CM | POA: Diagnosis present

## 2018-09-22 DIAGNOSIS — E872 Acidosis: Secondary | ICD-10-CM | POA: Diagnosis present

## 2018-09-22 DIAGNOSIS — Z87891 Personal history of nicotine dependence: Secondary | ICD-10-CM | POA: Diagnosis not present

## 2018-09-22 DIAGNOSIS — Z79899 Other long term (current) drug therapy: Secondary | ICD-10-CM | POA: Diagnosis not present

## 2018-09-22 DIAGNOSIS — Z96653 Presence of artificial knee joint, bilateral: Secondary | ICD-10-CM | POA: Diagnosis present

## 2018-09-22 DIAGNOSIS — K5900 Constipation, unspecified: Secondary | ICD-10-CM | POA: Diagnosis present

## 2018-09-22 DIAGNOSIS — Z90710 Acquired absence of both cervix and uterus: Secondary | ICD-10-CM | POA: Diagnosis not present

## 2018-09-22 DIAGNOSIS — E785 Hyperlipidemia, unspecified: Secondary | ICD-10-CM | POA: Diagnosis present

## 2018-09-22 DIAGNOSIS — I951 Orthostatic hypotension: Secondary | ICD-10-CM | POA: Diagnosis present

## 2018-09-22 DIAGNOSIS — Z96611 Presence of right artificial shoulder joint: Secondary | ICD-10-CM | POA: Diagnosis present

## 2018-09-22 DIAGNOSIS — E876 Hypokalemia: Secondary | ICD-10-CM | POA: Diagnosis present

## 2018-09-22 DIAGNOSIS — K449 Diaphragmatic hernia without obstruction or gangrene: Secondary | ICD-10-CM | POA: Diagnosis present

## 2018-09-22 DIAGNOSIS — K8020 Calculus of gallbladder without cholecystitis without obstruction: Secondary | ICD-10-CM | POA: Diagnosis present

## 2018-09-22 DIAGNOSIS — I10 Essential (primary) hypertension: Secondary | ICD-10-CM | POA: Diagnosis present

## 2018-09-22 DIAGNOSIS — M810 Age-related osteoporosis without current pathological fracture: Secondary | ICD-10-CM | POA: Diagnosis present

## 2018-09-22 DIAGNOSIS — F329 Major depressive disorder, single episode, unspecified: Secondary | ICD-10-CM | POA: Diagnosis present

## 2018-09-22 DIAGNOSIS — Z91010 Allergy to peanuts: Secondary | ICD-10-CM | POA: Diagnosis not present

## 2018-09-22 DIAGNOSIS — K219 Gastro-esophageal reflux disease without esophagitis: Secondary | ICD-10-CM | POA: Diagnosis present

## 2018-09-22 LAB — CBC
HCT: 31.3 % — ABNORMAL LOW (ref 36.0–46.0)
Hemoglobin: 9.8 g/dL — ABNORMAL LOW (ref 12.0–15.0)
MCH: 31.4 pg (ref 26.0–34.0)
MCHC: 31.3 g/dL (ref 30.0–36.0)
MCV: 100.3 fL — ABNORMAL HIGH (ref 80.0–100.0)
Platelets: 218 10*3/uL (ref 150–400)
RBC: 3.12 MIL/uL — AB (ref 3.87–5.11)
RDW: 12.5 % (ref 11.5–15.5)
WBC: 7.6 10*3/uL (ref 4.0–10.5)
nRBC: 0 % (ref 0.0–0.2)

## 2018-09-22 LAB — COMPREHENSIVE METABOLIC PANEL
ALT: 12 U/L (ref 0–44)
AST: 17 U/L (ref 15–41)
Albumin: 2.6 g/dL — ABNORMAL LOW (ref 3.5–5.0)
Alkaline Phosphatase: 54 U/L (ref 38–126)
Anion gap: 6 (ref 5–15)
BUN: 25 mg/dL — ABNORMAL HIGH (ref 8–23)
CALCIUM: 8.2 mg/dL — AB (ref 8.9–10.3)
CO2: 22 mmol/L (ref 22–32)
Chloride: 111 mmol/L (ref 98–111)
Creatinine, Ser: 1.15 mg/dL — ABNORMAL HIGH (ref 0.44–1.00)
GFR calc Af Amer: 53 mL/min — ABNORMAL LOW (ref >60)
GFR calc non Af Amer: 46 mL/min — ABNORMAL LOW (ref >60)
Glucose, Bld: 93 mg/dL (ref 70–99)
Potassium: 3.5 mmol/L (ref 3.5–5.1)
Sodium: 139 mmol/L (ref 135–145)
Total Bilirubin: 0.6 mg/dL (ref 0.3–1.2)
Total Protein: 5.4 g/dL — ABNORMAL LOW (ref 6.5–8.1)

## 2018-09-22 LAB — URINE CULTURE
CULTURE: NO GROWTH
SPECIAL REQUESTS: NORMAL

## 2018-09-22 MED ORDER — VITAMIN D (ERGOCALCIFEROL) 1.25 MG (50000 UNIT) PO CAPS
50000.0000 [IU] | ORAL_CAPSULE | ORAL | Status: DC
  Administered 2018-09-23: 50000 [IU] via ORAL
  Filled 2018-09-22: qty 1

## 2018-09-22 MED ORDER — ZOLPIDEM TARTRATE 10 MG PO TABS
10.0000 mg | ORAL_TABLET | Freq: Every evening | ORAL | Status: DC | PRN
  Administered 2018-09-22 (×2): 10 mg via ORAL
  Filled 2018-09-22 (×2): qty 1

## 2018-09-22 MED ORDER — SODIUM CHLORIDE 0.9 % IV SOLN
INTRAVENOUS | Status: DC
  Administered 2018-09-22: 08:00:00 via INTRAVENOUS

## 2018-09-22 MED ORDER — VENLAFAXINE HCL ER 150 MG PO CP24
150.0000 mg | ORAL_CAPSULE | Freq: Every day | ORAL | Status: DC
  Administered 2018-09-23: 150 mg via ORAL
  Filled 2018-09-22: qty 1

## 2018-09-22 MED ORDER — ENOXAPARIN SODIUM 40 MG/0.4ML ~~LOC~~ SOLN: 40.0000 mg | SUBCUTANEOUS | Status: DC

## 2018-09-22 MED ORDER — ENOXAPARIN SODIUM 40 MG/0.4ML ~~LOC~~ SOLN
40.0000 mg | SUBCUTANEOUS | Status: DC
  Administered 2018-09-22 – 2018-09-23 (×2): 40 mg via SUBCUTANEOUS
  Filled 2018-09-22 (×2): qty 0.4

## 2018-09-22 NOTE — Evaluation (Signed)
Physical Therapy Evaluation Patient Details Name: Maria George MRN: [id number] DOB: [birth year] Today's Date: 09/22/2018   History of Present Illness  78 yo female admitted with N/V/D, near syncope, AFT, CT (+) gallstones. Hx of recent bunionectomy 09/14/18, OA  Clinical Impression  On eval, pt required Min assist for mobility. She was able to stand and pivot from the bed to the recliner using a RW. Pt c/o mild lightheadedness. Orthostatics were (-). Daughter was present during session. Discussed d/c plan-pt plans to return home. She is agreeable to HHPT f/u. Recommended to pt that she consider staying with her daughter for a few day before returning home alone. Will continue to follow and progress activity as tolerated.     Follow Up Recommendations Home health PT;Supervision/Assistance - 24 hour    Equipment Recommendations  None recommended by PT    Recommendations for Other Services       Precautions / Restrictions Precautions Precautions: Fall Required Braces or Orthoses: Other Brace Other Brace: post op shoe Restrictions Weight Bearing Restrictions: Yes RLE Weight Bearing: Weight bearing as tolerated Other Position/Activity Restrictions: through heel with post op shoe      Mobility  Bed Mobility Overal bed mobility: Needs Assistance Bed Mobility: Supine to Sit     Supine to sit: HOB elevated;Min assist     General bed mobility comments: Assist for trunk. Increased time.   Transfers Overall transfer level: Needs assistance Equipment used: Rolling walker (2 wheeled) Transfers: Sit to/from [employer] Sit to Stand: Min assist Stand pivot transfers: Min assist       General transfer comment: Assist to rise, stabilize, control descent. VCs safety, technique, hand placemenet. Stand pivot, bed to recliner, with RW.   Ambulation/Gait             General Gait Details: NT on today. Will attempt next session  Stairs             Wheelchair Mobility    Modified Rankin (Stroke Patients Only)       Balance Overall balance assessment: Needs assistance         Standing balance support: Bilateral upper extremity supported Standing balance-Leahy Scale: Poor                               Pertinent Vitals/Pain Pain Assessment: Faces Faces Pain Scale: Hurts little more Pain Location: R foot Pain Descriptors / Indicators: Discomfort;Aching;Tingling Pain Intervention(s): Monitored during session;Limited activity within patient's tolerance;Repositioned    Home Living Family/patient expects to be discharged to:: Unsure Living Arrangements: Alone Available Help at Discharge: Family;Available PRN/intermittently Type of Home: Mobile home Home Access: Stairs to enter Entrance Stairs-Rails: Left Entrance Stairs-Number of Steps: 3 Home Layout: One level Home Equipment: Walker - 2 wheels;Cane - single point      Prior Function Level of Independence: Independent with assistive device(s)         Comments: using cane vs RW for ambulation     Hand Dominance        Extremity/Trunk Assessment   Upper Extremity Assessment Upper Extremity Assessment: Generalized weakness    Lower Extremity Assessment Lower Extremity Assessment: Generalized weakness    Cervical / Trunk Assessment Cervical / Trunk Assessment: Normal  Communication   Communication: No difficulties  Cognition Arousal/Alertness: Awake/alert Behavior During Therapy: WFL for tasks assessed/performed Overall Cognitive Status: Within Functional Limits for tasks assessed  General Comments      Exercises     Assessment/Plan    PT Assessment Patient needs continued PT services  PT Problem List Decreased strength;Decreased balance;Decreased mobility;Decreased activity tolerance;Pain;Decreased knowledge of use of DME       PT Treatment Interventions DME  instruction;Gait training;Functional mobility training;Therapeutic activities;Balance training;Patient/family education;Therapeutic exercise    PT Goals (Current goals can be found in the Care Plan section)  Acute Rehab PT Goals Patient Stated Goal: regain independence PT Goal Formulation: With patient/family Time For Goal Achievement: 10/06/18 Potential to Achieve Goals: Good    Frequency Min 3X/week   Barriers to discharge        Co-evaluation               AM-PAC PT "6 Clicks" Mobility  Outcome Measure Help needed turning from your back to your side while in a flat bed without using bedrails?: A Little Help needed moving from lying on your back to sitting on the side of a flat bed without using bedrails?: A Little Help needed moving to and from a bed to a chair (including a wheelchair)?: A Little Help needed standing up from a chair using your arms (e.g., wheelchair or bedside chair)?: A Little Help needed to walk in hospital room?: A Little Help needed climbing 3-5 steps with a railing? : A Little 6 Click Score: 18    End of Session Equipment Utilized During Treatment: Other (comment)(post op shoe) Activity Tolerance: Patient tolerated treatment well Patient left: in chair;with call bell/phone within reach;with family/visitor present   PT Visit Diagnosis: Unsteadiness on feet (R26.81);Muscle weakness (generalized) (M62.81);Other abnormalities of gait and mobility (R26.89);Difficulty in walking, not elsewhere classified (R26.2)    Time: 8341-9622 PT Time Calculation (min) (ACUTE ONLY): 33 min   Charges:   PT Evaluation $PT Eval Moderate Complexity: 1 Mod PT Treatments $Gait Training: 8-22 mins          Rebeca Alert, PT Acute Rehabilitation Services Pager: [phone] Office: [phone]

## 2018-09-22 NOTE — Progress Notes (Signed)
PROGRESS NOTE  Maria George HWK:[id number] DOB: 1940-08-29 DOA: 09/20/2018 PCP: Samuel Jester, DO  HPI/Recap of past 24 hours: CALLE RENTSCHLER is a 78 y.o. female with history of hypertension, depression who had a bunionectomy of the right foot on September 14, 2018 and had gone to orthopedic office yesterday for rewrapping her foot because it got wet started having nausea vomiting and diarrhea an hour after having chicken and milk at a fast food center.  Has been having multiple episodes of vomiting and diarrhea nonbloody.  Crampy abdominal pain.  Patient feels weak and almost passed out.  Denies any chest pain or shortness of breath.  Did not have any fever or chills.  09/22/18: Patient was seen and examined at her bedside.  Reports persistent diarrhea this morning with no abdominal cramping.  Nausea has resolved.  GI PCR and C. Difficile PCR ordered and pending.  Assessment/Plan: Principal Problem:   ARF (acute renal failure) (HCC) Active Problems:   Essential hypertension   Nausea vomiting and diarrhea   Acute renal failure (ARF) (HCC)  Persistent diarrhea of unclear etiology GI panel PCR and C. difficile PCR pending Continue to monitor electrolytes Continue gentle IV fluid hydration  Nausea vomiting, resolving IV Zofran as needed  AKI, prerenal secondary to dehydration Presented with creatinine of 2.34 Creatinine is improving from 2.34 to 1.15 Diarrhea is persistent Continue IV fluid hydration Monitor urine output  Hypotension Blood pressure continues to be on the lower side of normal Maintain map greater than 65 Continue IV fluid  Cholelithiasis without evidence of cholecystitis HIDA scan negative Monitor LFTs  Moderate to large esophageal hiatal hernia Consider PPI if symptomatic  Chronic depression/anxiety Resume psych medications  Recent right foot bunionectomy PT to assess  Risks: High risk for decompensation due to persistent diarrhea in the setting  of recent acute kidney injury secondary to dehydration currently receiving IV fluids for rehydration.  Patient will require at least 2 midnights for further evaluation and treatment of present condition.    DVT prophylaxis:  Subcu Lovenox daily Code Status: Full code. Family Communication:  None at bedside. Disposition Plan:  DC to home when clinically stable and diarrhea is resolving Consults called: None.    Objective: Vitals:   09/21/18 2257 09/22/18 0036 09/22/18 0438 09/22/18 0442  BP: (!) 107/55 (!) 101/54  (!) 101/58  Pulse:  88  86  Resp:    18  Temp:    98.5 F (36.9 C)  TempSrc:    Oral  SpO2:    95%  Weight:   88.1 kg   Height:        Intake/Output Summary (Last 24 hours) at 09/22/2018 1237 Last data filed at 09/22/2018 1000 Gross per 24 hour  Intake 3138.11 ml  Output 500 ml  Net 2638.11 ml   Filed Weights   09/21/18 0734 09/22/18 0438  Weight: 83.9 kg 88.1 kg    Exam:  . General: 78 y.o. year-old female well developed well nourished in no acute distress.  Alert and oriented x3. . Cardiovascular: Regular rate and rhythm with no rubs or gallops.  No thyromegaly or JVD noted.   Marland Kitchen Respiratory: Clear to auscultation with no wheezes or rales. Good inspiratory effort. . Abdomen: Soft nontender nondistended with normal bowel sounds x4 quadrants. . Musculoskeletal: No lower extremity edema. 2/4 pulses in all 4 extremities. Marland Kitchen Psychiatry: Mood is appropriate for condition and setting   Data Reviewed: CBC: Recent Labs  Lab 09/20/18 2256 09/21/18 0608 09/22/18  0432  WBC 11.9* 9.5 7.6  NEUTROABS 9.6* 7.3  --   HGB 13.7 12.0 9.8*  HCT 42.9 37.7 31.3*  MCV 96.8 97.7 100.3*  PLT 362 278 218   Basic Metabolic Panel: Recent Labs  Lab 09/20/18 2256 09/21/18 0608 09/22/18 0432  NA 139 139 139  K 3.9 4.1 3.5  CL 100 107 111  CO2 26 23 22   GLUCOSE 133* 97 93  BUN 50* 48* 25*  CREATININE 2.57* 2.34* 1.15*  CALCIUM 9.9 8.6* 8.2*   GFR: Estimated  Creatinine Clearance: 42.2 mL/min (A) (by C-G formula based on SCr of 1.15 mg/dL (H)). Liver Function Tests: Recent Labs  Lab 09/20/18 2256 09/21/18 0608 09/22/18 0432  AST 32 26 17  ALT 19 18 12   ALKPHOS 66 58 54  BILITOT 0.5 0.5 0.6  PROT 7.2 6.2* 5.4*  ALBUMIN 3.5 3.0* 2.6*   No results for input(s): LIPASE, AMYLASE in the last 168 hours. No results for input(s): AMMONIA in the last 168 hours. Coagulation Profile: No results for input(s): INR, PROTIME in the last 168 hours. Cardiac Enzymes: No results for input(s): CKTOTAL, CKMB, CKMBINDEX, TROPONINI in the last 168 hours. BNP (last 3 results) No results for input(s): PROBNP in the last 8760 hours. HbA1C: No results for input(s): HGBA1C in the last 72 hours. CBG: No results for input(s): GLUCAP in the last 168 hours. Lipid Profile: No results for input(s): CHOL, HDL, LDLCALC, TRIG, CHOLHDL, LDLDIRECT in the last 72 hours. Thyroid Function Tests: No results for input(s): TSH, T4TOTAL, FREET4, T3FREE, THYROIDAB in the last 72 hours. Anemia Panel: No results for input(s): VITAMINB12, FOLATE, FERRITIN, TIBC, IRON, RETICCTPCT in the last 72 hours. Urine analysis:    Component Value Date/Time   COLORURINE AMBER (A) 09/21/2018 0142   APPEARANCEUR HAZY (A) 09/21/2018 0142   LABSPEC 1.019 09/21/2018 0142   PHURINE 5.0 09/21/2018 0142   GLUCOSEU NEGATIVE 09/21/2018 0142   HGBUR NEGATIVE 09/21/2018 0142   BILIRUBINUR NEGATIVE 09/21/2018 0142   KETONESUR NEGATIVE 09/21/2018 0142   PROTEINUR 30 (A) 09/21/2018 0142   UROBILINOGEN 1.0 07/11/2013 1751   NITRITE NEGATIVE 09/21/2018 0142   LEUKOCYTESUR NEGATIVE 09/21/2018 0142   Sepsis Labs: @LABRCNTIP (procalcitonin:4,lacticidven:4)  ) Recent Results (from the past 240 hour(s))  Urine culture     Status: None   Collection Time: 09/21/18  1:43 AM  Result Value Ref Range Status   Specimen Description   Final    URINE, CLEAN CATCH Performed at [facility],  [address], [place], [place] [address]    Special Requests   Final    Normal Performed at [facility], [address], [place], [place] [address]    Culture   Final    NO GROWTH Performed at [facility], [address] [place], [place] [address]    Report Status 09/22/2018 FINAL  Final      Studies: Nm Hepato W/eject Fract  Result Date: 09/21/2018 CLINICAL DATA:  Cholelithiasis EXAM: NUCLEAR MEDICINE HEPATOBILIARY IMAGING WITH GALLBLADDER EF TECHNIQUE: Sequential images of the abdomen were obtained out to 60 minutes following intravenous administration of radiopharmaceutical. After oral ingestion of Ensure, gallbladder ejection fraction was determined. At 60 min, normal ejection fraction is greater than 33%. RADIOPHARMACEUTICALS:  5.5 mCi Tc-21m  Choletec IV COMPARISON:  None. FINDINGS: Prompt uptake and biliary excretion of activity by the liver is seen. Gallbladder activity is visualized, consistent with patency of cystic duct. Biliary activity passes into small bowel, consistent with  patent common bile duct. Calculated gallbladder ejection fraction is 78%. (Normal gallbladder ejection fraction with Ensure is greater than 33%.) IMPRESSION: 1.  The cystic and common bile ducts are patent. 2.  Normal gallbladder ejection fraction of 78%. Electronically Signed   By: Lauralyn Primes M.D.   On: 09/21/2018 13:29    Scheduled Meds: . gabapentin  600 mg Oral BID  . sodium chloride flush  3 mL Intravenous Once  . venlafaxine XR  150 mg Oral Q breakfast  . [START ON 09/23/2018] Vitamin D (Ergocalciferol)  50,000 Units Oral Once per day on Mon Thu    Continuous Infusions: . sodium chloride 75 mL/hr at 09/22/18 1000     LOS: 0 days     Darlin Drop, MD [facility] Pager [phone]  If 7PM-7AM, please contact night-coverage [email or web address] Password [facility] 09/22/2018, 12:37 PM

## 2018-09-23 LAB — CBC
HCT: 31 % — ABNORMAL LOW (ref 36.0–46.0)
HEMOGLOBIN: 9.9 g/dL — AB (ref 12.0–15.0)
MCH: 31.3 pg (ref 26.0–34.0)
MCHC: 31.9 g/dL (ref 30.0–36.0)
MCV: 98.1 fL (ref 80.0–100.0)
Platelets: 229 10*3/uL (ref 150–400)
RBC: 3.16 MIL/uL — AB (ref 3.87–5.11)
RDW: 12.3 % (ref 11.5–15.5)
WBC: 6.6 10*3/uL (ref 4.0–10.5)
nRBC: 0 % (ref 0.0–0.2)

## 2018-09-23 LAB — COMPREHENSIVE METABOLIC PANEL
ALK PHOS: 53 U/L (ref 38–126)
ALT: 12 U/L (ref 0–44)
AST: 17 U/L (ref 15–41)
Albumin: 2.6 g/dL — ABNORMAL LOW (ref 3.5–5.0)
Anion gap: 4 — ABNORMAL LOW (ref 5–15)
BILIRUBIN TOTAL: 0.2 mg/dL — AB (ref 0.3–1.2)
BUN: 16 mg/dL (ref 8–23)
CO2: 24 mmol/L (ref 22–32)
CREATININE: 0.94 mg/dL (ref 0.44–1.00)
Calcium: 8.3 mg/dL — ABNORMAL LOW (ref 8.9–10.3)
Chloride: 113 mmol/L — ABNORMAL HIGH (ref 98–111)
GFR calc Af Amer: >60 mL/min (ref >60)
GFR calc non Af Amer: 59 mL/min — ABNORMAL LOW (ref >60)
Glucose, Bld: 92 mg/dL (ref 70–99)
Potassium: 3.3 mmol/L — ABNORMAL LOW (ref 3.5–5.1)
Sodium: 141 mmol/L (ref 135–145)
TOTAL PROTEIN: 5.4 g/dL — AB (ref 6.5–8.1)

## 2018-09-23 MED ORDER — AMLODIPINE BESYLATE 5 MG PO TABS
5.0000 mg | ORAL_TABLET | Freq: Every day | ORAL | Status: DC
  Administered 2018-09-23: 5 mg via ORAL
  Filled 2018-09-23: qty 1

## 2018-09-23 MED ORDER — POTASSIUM CHLORIDE CRYS ER 20 MEQ PO TBCR
40.0000 meq | EXTENDED_RELEASE_TABLET | Freq: Two times a day (BID) | ORAL | Status: DC
  Administered 2018-09-23: 40 meq via ORAL
  Filled 2018-09-23: qty 2

## 2018-09-23 MED ORDER — AMLODIPINE BESYLATE 5 MG PO TABS: 5.0000 mg | ORAL_TABLET | Freq: Every day | ORAL | 0 refills | Status: DC

## 2018-09-23 NOTE — Discharge Instructions (Signed)
Dehydration, Adult  Dehydration is when there is not enough fluid or water in your body. This happens when you lose more fluids than you take in. Dehydration can range from mild to very bad. It should be treated right away to keep it from getting very bad. Symptoms of mild dehydration may include:  Thirst.  Dry lips.  Slightly dry mouth.  Dry, warm skin.  Dizziness. Symptoms of moderate dehydration may include:  Very dry mouth.  Muscle cramps.  Dark pee (urine). Pee may be the color of tea.  Your body making less pee.  Your eyes making fewer tears.  Heartbeat that is uneven or faster than normal (palpitations).  Headache.  Light-headedness, especially when you stand up from sitting.  Fainting (syncope). Symptoms of very bad dehydration may include:  Changes in skin, such as: ? Cold and clammy skin. ? Blotchy (mottled) or pale skin. ? Skin that does not quickly return to normal after being lightly pinched and let go (poor skin turgor).  Changes in body fluids, such as: ? Feeling very thirsty. ? Your eyes making fewer tears. ? Not sweating when body temperature is high, such as in hot weather. ? Your body making very little pee.  Changes in vital signs, such as: ? Weak pulse. ? Pulse that is more than 100 beats a minute when you are sitting still. ? Fast breathing. ? Low blood pressure.  Other changes, such as: ? Sunken eyes. ? Cold hands and feet. ? Confusion. ? Lack of energy (lethargy). ? Trouble waking up from sleep. ? Short-term weight loss. ? Unconsciousness. Follow these instructions at home:   If told by your doctor, drink an ORS: ? Make an ORS by using instructions on the package. ? Start by drinking small amounts, about  cup (120 mL) every 5-10 minutes. ? Slowly drink more until you have had the amount that your doctor said to have.  Drink enough clear fluid to keep your pee clear or pale yellow. If you were told to drink an ORS, finish the  ORS first, then start slowly drinking clear fluids. Drink fluids such as: ? Water. Do not drink only water by itself. Doing that can make the salt (sodium) level in your body get too low (hyponatremia). ? Ice chips. ? Fruit juice that you have added water to (diluted). ? Low-calorie sports drinks.  Avoid: ? Alcohol. ? Drinks that have a lot of sugar. These include high-calorie sports drinks, fruit juice that does not have water added, and soda. ? Caffeine. ? Foods that are greasy or have a lot of fat or sugar.  Take over-the-counter and prescription medicines only as told by your doctor.  Do not take salt tablets. Doing that can make the salt level in your body get too high (hypernatremia).  Eat foods that have minerals (electrolytes). Examples include bananas, oranges, potatoes, tomatoes, and spinach.  Keep all follow-up visits as told by your doctor. This is important. Contact a doctor if:  You have belly (abdominal) pain that: ? Gets worse. ? Stays in one area (localizes).  You have a rash.  You have a stiff neck.  You get angry or annoyed more easily than normal (irritability).  You are more sleepy than normal.  You have a harder time waking up than normal.  You feel: ? Weak. ? Dizzy. ? Very thirsty.  You have peed (urinated) only a small amount of very dark pee during 6-8 hours. Get help right away if:  You have  symptoms of very bad dehydration.  You cannot drink fluids without throwing up (vomiting).  Your symptoms get worse with treatment.  You have a fever.  You have a very bad headache.  You are throwing up or having watery poop (diarrhea) and it: ? Gets worse. ? Does not go away.  You have blood or something green (bile) in your throw-up.  You have blood in your poop (stool). This may cause poop to look black and tarry.  You have not peed in 6-8 hours.  You pass out (faint).  Your heart rate when you are sitting still is more than 100 beats a  minute.  You have trouble breathing. This information is not intended to replace advice given to you by your health care provider. Make sure you discuss any questions you have with your health care provider. Document Released: 05/31/2009 Document Revised: 02/22/2016 Document Reviewed: 09/28/2015 Elsevier Interactive Patient Education  2019 Elsevier Inc.   Dehydration  Dehydration is when there is not enough fluid or water in your body. This happens when you lose more fluids than you take in. People who are age 78 or older have a higher risk of getting dehydrated. Dehydration can range from mild to very bad. It should be treated right away to keep it from getting very bad. Symptoms of mild dehydration may include:  Thirst.  Dry lips.  Slightly dry mouth.  Dry, warm skin.  Dizziness. Symptoms of moderate dehydration may include:  Very dry mouth.  Muscle cramps.  Dark pee (urine). Pee may be the color of tea.  Your body making less pee.  Your eyes making fewer tears.  Heartbeat that is uneven or faster than normal (palpitations).  Headache.  Light-headedness, especially when you stand up from sitting.  Fainting (syncope). Symptoms of very bad dehydration may include:  Changes in skin, such as: ? Cold and clammy skin. ? Blotchy (mottled) or pale skin. ? Skin that does not quickly return to normal after being lightly pinched and let go (poor skin turgor).  Changes in body fluids, such as: ? Feeling very thirsty. ? Your eyes making fewer tears. ? Not sweating when body temperature is high, such as in hot weather. ? Your body making very little pee.  Changes in vital signs, such as: ? Weak pulse. ? Pulse that is more than 100 beats a minute when you are sitting still. ? Fast breathing. ? Low blood pressure.  Other changes, such as: ? Sunken eyes. ? Cold hands and feet. ? Confusion. ? Lack of energy (lethargy). ? Trouble waking up from sleep. ? Short-term  weight loss. ? Unconsciousness. Follow these instructions at home:   If told by your doctor, drink an ORS: ? Make an ORS by using instructions on the package. ? Start by drinking small amounts, about  cup (120 mL) every 5-10 minutes. ? Slowly drink more until you have had the amount that your doctor said to have.  Drink enough clear fluid to keep your pee clear or pale yellow. If you were told to drink an ORS, finish the ORS first, then start slowly drinking clear fluids. Drink fluids such as: ? Water. Do not drink only water by itself. Doing that can make the salt (sodium) level in your body get too low (hyponatremia). ? Ice chips. ? Fruit juice that you have added water to (diluted). ? Low-calorie sports drinks.  Avoid: ? Alcohol. ? Drinks that have a lot of sugar. These include high-calorie sports drinks, fruit  juice that does not have water added, and soda. ? Caffeine. ? Foods that are greasy or have a lot of fat or sugar.  Take over-the-counter and prescription medicines only as told by your doctor.  Do not take salt tablets. Doing that can make the salt level in your body get too high (hypernatremia).  Eat foods that have minerals (electrolytes). Examples include bananas, oranges, potatoes, tomatoes, and spinach.  Keep all follow-up visits as told by your doctor. This is important. Contact a doctor if:  You have belly (abdominal) pain that: ? Gets worse. ? Stays in one area (localizes).  You have a rash.  You have a stiff neck.  You get angry or annoyed more easily than normal (irritability).  You are more sleepy than normal.  You have a harder time waking up than normal.  You feel: ? Weak. ? Dizzy. ? Very thirsty. Get help right away if:  You have symptoms of very bad dehydration.  You cannot drink fluids without throwing up (vomiting).  Your symptoms get worse with treatment.  You have a fever.  You have a very bad headache.  You are throwing up  or having watery poop (diarrhea) and it: ? Gets worse. ? Does not go away.  You have diarrhea for more than 24 hours.  You have blood or something green (bile) in your throw-up.  You have blood in your poop (stool). This may cause poop to look black and tarry.  You have not peed in 6-8 hours.  You have peed (urinated) only a small amount of very dark pee during 6-8 hours.  You pass out (faint).  Your heart rate when you are sitting still is more than 100 beats a minute.  You have trouble breathing. This information is not intended to replace advice given to you by your health care provider. Make sure you discuss any questions you have with your health care provider. Document Released: 07/24/2011 Document Revised: 02/22/2016 Document Reviewed: 09/28/2015 Elsevier Interactive Patient Education  2019 Elsevier Inc.   Rehydration Rehydration is the replacement of body fluids and salts and minerals (electrolytes) that are lost during dehydration. Dehydration is when there is not enough fluid or water in the body. This happens when you lose more fluids than you take in. People who are age 56 or older have a higher risk of dehydration than younger adults. Common causes of dehydration include:  Vomiting.  Diarrhea.  Excessive sweating, such as from heat exposure or exercise.  Taking medicines that cause the body to lose excess fluid (diuretics).  Impaired kidney function.  Not drinking enough fluid.  Certain illnesses or infections.  Certain poorly controlled long-term (chronic) illnesses, such as diabetes, heart disease, and kidney disease.  Symptoms of mild dehydration may include thirst, dry lips and mouth, dry skin, and dizziness. Symptoms of severe dehydration may include increased heart rate, confusion, fainting, and not urinating. You can rehydrate by drinking certain fluids or getting fluids through an IV tube, as told by your health care provider. What are the  risks? Generally, rehydration is safe. However, one problem that can happen is taking in too much fluid (overhydration). This is rare. If overhydration happens, it can cause an electrolyte imbalance, kidney failure, fluid in the lungs, or a decrease in salt (sodium) levels in the body. How to rehydrate Follow instructions from your health care provider for rehydration. The kind of fluid you should drink and the amount you should drink depend on your condition.  If  directed by your health care provider, drink an oral rehydration solution (ORS). This is a drink designed to treat dehydration that is found in pharmacies and retail stores. ? Make an ORS by following instructions on the package. ? Start by drinking small amounts, about  cup (120 mL) every 5-10 minutes. ? Slowly increase how much you drink until you have taken the amount recommended by your health care provider.  Drink enough clear fluids to keep your urine clear or pale yellow. If you were instructed to drink an ORS, finish the ORS first, then start slowly drinking other clear fluids. Drink fluids such as: ? Water. Do not drink only water. Doing that can lead to having too little sodium in your body (hyponatremia). ? Ice chips. ? Fruit juice that you have added water to (diluted juice). ? Low-calorie sports drinks.  If you are severely dehydrated, your health care provider may recommend that you receive fluids through an IV tube in the hospital.  Do not take sodium tablets. Doing that can lead to the condition of having too much sodium in your body (hypernatremia). Eating while you rehydrate Follow instructions from your health care provider about what to eat while you rehydrate. Your health care provider may recommend that you slowly begin eating regular foods in small amounts.  Eat foods that contain a healthy balance of electrolytes, such as bananas, oranges, potatoes, tomatoes, and spinach.  Avoid foods that are greasy or  contain a lot of fat or sugar.  In some cases, you may get nutrition through a feeding tube that is passed through your nose and into your stomach (nasogastric tube, or NG tube). This may be done if you have uncontrolled vomiting or diarrhea. Beverages to avoid Certain beverages may make dehydration worse. While you rehydrate, avoid:  Alcohol.  Caffeine.  Drinks that contain a lot of sugar. These include: ? High-calorie sports drinks. ? Fruit juice that is not diluted. ? Soda.  Check nutrition labels to see how much sugar or caffeine a beverage contains. Signs of dehydration recovery You may be recovering from dehydration if:  You urinate more often than you did before you started rehydrating.  Your urine is clear or pale yellow.  Your energy level improves.  You vomit less frequently.  You have diarrhea less frequently.  Your appetite improves or returns to normal.  You feel less dizzy or less light-headed.  Your skin tone and color start to look more normal. Contact a health care provider if:  You continue to have symptoms of mild dehydration, such as: ? Thirst. ? Dry lips. ? Slightly dry mouth. ? Dry, warm skin. ? Dizziness.  You continue to vomit or have diarrhea. Get help right away if:  You have symptoms of dehydration that get worse.  You feel: ? Confused. ? Weak. ? Like you are going to faint.  You have not urinated in 6-8 hours.  You have very dark urine.  You have trouble breathing.  Your heart rate while sitting still is over 100 beats a minute.  You cannot drink fluids without vomiting.  You have vomiting or diarrhea that: ? Gets worse. ? Does not go away.  You have a fever. This information is not intended to replace advice given to you by your health care provider. Make sure you discuss any questions you have with your health care provider. Document Released: 10/27/2011 Document Revised: 02/22/2016 Document Reviewed:  09/28/2015 Elsevier Interactive Patient Education  2019 [employer]   Near-Syncope  Near-syncope is when you suddenly get weak or dizzy, or you feel like you might pass out (faint). This is due to a lack of blood flow to the brain. During an episode of near-syncope, you may:  Feel dizzy or light-headed.  Feel sick to your stomach (nauseous).  See all white or all black.  Have cold, clammy skin. This condition is caused by a sudden decrease in blood flow to the brain. This decrease can result from various causes, but most of those causes are not dangerous. However, near-syncope may be a sign of a serious medical problem, so it is important to seek medical care. Follow these instructions at home: Pay attention to any changes in your symptoms. Take these actions to help with your condition:  Have someone stay with you until you feel stable.  Talk with your doctor about your symptoms. You may need to have testing to understand the cause of your near-syncope.  Do not drive, use machinery, or play sports until your doctor says it is okay.  Keep all follow-up visits as told by your doctor. This is important.  If you start to feel like you might pass out, lie down right away and raise (elevate) your feet above the level of your heart. Breathe deeply and steadily. Wait until all of the symptoms are gone.  Drink enough fluid to keep your pee (urine) pale yellow. Medicines  If you are taking blood pressure or heart medicine, get up slowly and spend many minutes getting ready to sit and then stand. This can help with dizziness.  Take over-the-counter and prescription medicines only as told by your doctor. Get help right away if you:  Have a seizure.  Have pain in your: ? Chest. ? Tummy, ? Back.  Faint.  Have a bad headache.  Are bleeding from your mouth or butt.  Have black or tarry poop (stool).  Have a very fast or uneven heartbeat (palpitations).  Are confused.  Have  trouble walking.  Are very weak.  Have trouble seeing. These symptoms may represent a serious problem that is an emergency. Do not wait to see if your symptoms will go away. Get medical help right away. Call your local emergency services (911 in the [place] Do not drive yourself to the hospital. Summary  Near-syncope is when you suddenly get weak or dizzy, or you feel like you might pass out.  This condition is caused by a lack of blood flow to the brain.  Near-syncope may be a sign of a serious medical problem, so it is important to seek medical care. This information is not intended to replace advice given to you by your health care provider. Make sure you discuss any questions you have with your health care provider. Document Released: 01/21/2008 Document Revised: 03/30/2018 Document Reviewed: 04/18/2015 Elsevier Interactive Patient Education  2019 [employer]

## 2018-09-23 NOTE — Care Management Note (Signed)
Case Management Note  Patient Details  Name: Maria George MRN: [id number] Date of Birth: 19-Oct-1940  Subjective/Objective:                    Action/Plan:Pt discharging home with [facility] for HHPT.    Expected Discharge Date:  (unknown)               Expected Discharge Plan:  Home w Home Health Services  In-House Referral:     Discharge planning Services  CM Consult  Post Acute Care Choice:    Choice offered to:  Patient  DME Arranged:    DME Agency:     HH Arranged:  PT HH Agency:  [facility] (now [facility])  Status of Service:  Completed, signed off  If discussed at [employer] of Stay Meetings, dates discussed:    Additional CommentsGeni Bers, RN 09/23/2018, 11:47 AM

## 2018-09-23 NOTE — Discharge Summary (Signed)
Discharge Summary  Maria George WHQ:[id number] DOB: [birth year]  PCP: Samuel Jester, DO  Admit date: 09/20/2018 Discharge date: 09/23/2018  Time spent: 35 minutes  Recommendations for Outpatient Follow-up:  1. Follow-up with your PCP 2. Follow-up with your orthopedic surgeon for your recent bunionectomy 3. Take your medications as prescribed 4. Continue physical therapy  Discharge Diagnoses:  Active Hospital Problems   Diagnosis Date Noted  . ARF (acute renal failure) (HCC) 09/21/2018  . Nausea vomiting and diarrhea 09/21/2018  . Acute renal failure (ARF) (HCC) 09/21/2018  . Essential hypertension 06/28/2014    Resolved Hospital Problems  No resolved problems to display.    Discharge Condition: Stable  Diet recommendation: Resume previous diet  Vitals:   09/23/18 0546 09/23/18 1304  BP: 136/65 (!) 156/86  Pulse: 75 88  Resp: 12 20  Temp: 97.9 F (36.6 C) 98.7 F (37.1 C)  SpO2: 94% 97%    History of present illness:   Maria Khalifa Rhodesis a 78 y.o.femalewithhistory of hypertension, depression who had a bunionectomy of the right foot on September 14, 2018 and had gone to orthopedic office for rewrapping her foot because it got wet.  Started having nausea vomiting and diarrhea an hour after having chicken and milk at a fast food center. Has been having multiple episodes of vomiting and nonbloody diarrhea.  Associated withcrampy abdominal pain.  Reports weakness and almost passing out. Denies any chest pain or shortness of breath. Did not have any fever or chills.  Presented with AKI suspected to be prerenal secondary to dehydration from vomiting and diarrhea.  Hospital course complicated by incidentally found cholelithiasis without evidence of cholecystitis.  09/23/2018: Patient seen and examined at her bedside.  No acute events overnight.  Diarrhea and nausea have resolved.  AKI has resolved.  Patient had positive orthostatic vital signs.  Advised to avoid  dehydration.  Blood cultures x2 and urine cultures unremarkable.  Afebrile with no leukocytosis.  Mildly elevated blood pressure.  Continue to hold lisinopril and HCTZ.  Started Norvasc 5 mg daily.  Will follow-up with her PCP.  On the day of discharge, the patient was hemodynamically stable.  She will need to follow-up with her primary care provider and orthopedic surgery posthospitalization.  She will also need to take her medications as prescribed.  Hospital Course:  Principal Problem:   ARF (acute renal failure) (HCC) Active Problems:   Essential hypertension   Nausea vomiting and diarrhea   Acute renal failure (ARF) (HCC)  Resolved diarrhea of unclear etiology Unable to collect due to resolution GI panel PCR and C. difficile PCR  Resolved nausea vomiting  Resolved AKI, prerenal secondary to dehydration from vomiting and diarrhea Presented with creatinine of 2.34 Creatinine is improving from 2.34 to 1.15 to baseline creatinine 0.94 Diarrhea and vomiting have resolved Continue to hold lisinopril; defer to PCP to restart lisinopril  Orthostatic hypotension Positive orthostatic vital signs on presentation Advised to avoid dehydration Continue to hold HCTZ; defer to PCP to restart HCTZ  Hypertension Started Norvasc 5 mg daily Follow-up with your PCP  Hypokalemia Potassium 3.3 Repleted Follow-up with PCP  Cholelithiasis without evidence of cholecystitis HIDA scan negative LFTs normal on 09/23/2018  Moderate to large esophageal hiatal hernia Consider PPI if symptomatic Defer to PCP to start PPI if indicated  Chronic depression/anxiety Continue psych medications  Recent right foot bunionectomy PT recommended home health PT Fall precautions Follow-up with your orthopedic surgeon Pain management in place   Code Status:Full code.  Discharge Exam: BP (!) 156/86 (BP Location: Right Arm)   Pulse 88   Temp 98.7 F (37.1 C) (Oral)   Resp 20   Ht 5\' 2"   (1.575 m)   Wt 87.9 kg   SpO2 97%   BMI 35.44 kg/m  . General: 78 y.o. year-old female well developed well nourished in no acute distress.  Alert and oriented x3. . Cardiovascular: Regular rate and rhythm with no rubs or gallops.  No thyromegaly or JVD noted.   Marland Kitchen. Respiratory: Clear to auscultation with no wheezes or rales. Good inspiratory effort. . Abdomen: Soft nontender nondistended with normal bowel sounds x4 quadrants. . Psychiatry: Mood is appropriate for condition and setting  Discharge Instructions You were cared for by a hospitalist during your hospital stay. If you have any questions about your discharge medications or the care you received while you were in the hospital after you are discharged, you can call the unit and asked to speak with the hospitalist on call if the hospitalist that took care of you is not available. Once you are discharged, your primary care physician will handle any further medical issues. Please note that NO REFILLS for any discharge medications will be authorized once you are discharged, as it is imperative that you return to your primary care physician (or establish a relationship with a primary care physician if you do not have one) for your aftercare needs so that they can reassess your need for medications and monitor your lab values.   Allergies as of 09/23/2018      Reactions   Peanut-containing Drug Products Swelling      Medication List    STOP taking these medications   BLACK COHOSH EXTRACT PO   cyclobenzaprine 10 MG tablet Commonly known as:  FLEXERIL   HYDROcodone-acetaminophen 5-325 MG tablet Commonly known as:  NORCO/VICODIN   lisinopril-hydrochlorothiazide 20-25 MG tablet Commonly known as:  PRINZIDE,ZESTORETIC   naproxen sodium 220 MG tablet Commonly known as:  ALEVE   oxyCODONE-acetaminophen 10-325 MG tablet Commonly known as:  PERCOCET     TAKE these medications   amLODipine 5 MG tablet Commonly known as:  NORVASC Take 1  tablet (5 mg total) by mouth daily. Start taking on:  September 24, 2018   clonazePAM 0.5 MG tablet Commonly known as:  KLONOPIN Take 0.5 mg by mouth 2 (two) times daily as needed for anxiety.   gabapentin 300 MG capsule Commonly known as:  NEURONTIN Take 600 mg by mouth 2 (two) times daily.   hydrOXYzine 25 MG tablet Commonly known as:  ATARAX/VISTARIL Take 25 mg by mouth 3 (three) times daily as needed for itching.   oxyCODONE 5 MG immediate release tablet Commonly known as:  Oxy IR/ROXICODONE Take 5 mg by mouth every 6 (six) hours as needed for severe pain.   SYSTANE 0.4-0.3 % Soln Generic drug:  Polyethyl Glycol-Propyl Glycol Apply 1 drop to eye 3 (three) times daily as needed (dry eyes).   venlafaxine XR 150 MG 24 hr capsule Commonly known as:  EFFEXOR-XR Take 150 mg by mouth daily with breakfast.   Vitamin D (Ergocalciferol) 1.25 MG (50000 UT) Caps capsule Commonly known as:  DRISDOL Take 50,000 Units by mouth 2 (two) times a week. Monday and Thursday   zolpidem 10 MG tablet Commonly known as:  AMBIEN Take 10 mg by mouth at bedtime as needed for sleep.      Allergies  Allergen Reactions  . Peanut-Containing Drug Products Swelling  The results of significant diagnostics from this hospitalization (including imaging, microbiology, ancillary and laboratory) are listed below for reference.    Significant Diagnostic Studies: Ct Abdomen Pelvis Wo Contrast  Result Date: 09/21/2018 CLINICAL DATA:  Abdominal distention, nausea, and vomiting. EXAM: CT ABDOMEN AND PELVIS WITHOUT CONTRAST TECHNIQUE: Multidetector CT imaging of the abdomen and pelvis was performed following the standard protocol without IV contrast. COMPARISON:  None. FINDINGS: Lower chest: Atelectasis in the lung bases. Moderate to large esophageal hiatal hernia. Hepatobiliary: Tiny stones layering in the gallbladder. No gallbladder wall thickening or edema. No bile duct dilatation. Subcentimeter  low-attenuation lesion in the left lobe of the liver probably representing a small cyst. No other focal liver lesions. Pancreas: Unremarkable. No pancreatic ductal dilatation or surrounding inflammatory changes. Spleen: Normal in size without focal abnormality. Adrenals/Urinary Tract: No adrenal gland nodules. Kidneys are atrophic. Cysts on the right kidney. No hydronephrosis or hydroureter. No renal, ureteral, or bladder stones. Bladder is decompressed. Stomach/Bowel: Stomach, small bowel, and colon are mostly decompressed. No wall thickening or inflammatory changes are appreciated. Appendix is not identified. Scattered diverticula in the sigmoid colon without evidence of diverticulitis. Vascular/Lymphatic: Aortic atherosclerosis. No enlarged abdominal or pelvic lymph nodes. Reproductive: Status post hysterectomy. No adnexal masses. Other: No free air or free fluid in the abdomen. Surgical clips and scarring in the pelvis, likely postoperative. Minimal umbilical hernia containing fat. Musculoskeletal: Degenerative changes in the spine. No destructive bone lesions. IMPRESSION: 1. Moderate to large esophageal hiatal hernia. 2. Cholelithiasis without evidence of cholecystitis. 3. No evidence of bowel obstruction or inflammation. Aortic Atherosclerosis (ICD10-I70.0). Electronically Signed   By: Burman Nieves M.D.   On: 09/21/2018 03:19   Dg Chest 2 View  Result Date: 09/20/2018 CLINICAL DATA:  78 year old female with hypoxia, weakness. Recent foot surgery. EXAM: CHEST - 2 VIEW COMPARISON:  Chest radiographs 06/29/2013. FINDINGS: Semi upright AP and lateral views of the chest. Right shoulder arthroplasty since 2014. No acute osseous abnormality identified. Paucity of bowel gas in the upper abdomen. Lung volumes and mediastinal contours remain within normal limits. No pneumothorax, pulmonary edema, pleural effusion or confluent pulmonary opacity. Visualized tracheal air column is within normal limits. IMPRESSION:  No acute cardiopulmonary abnormality. Electronically Signed   By: Odessa Fleming M.D.   On: 09/20/2018 23:28   Nm Hepato W/eject Fract  Result Date: 09/21/2018 CLINICAL DATA:  Cholelithiasis EXAM: NUCLEAR MEDICINE HEPATOBILIARY IMAGING WITH GALLBLADDER EF TECHNIQUE: Sequential images of the abdomen were obtained out to 60 minutes following intravenous administration of radiopharmaceutical. After oral ingestion of Ensure, gallbladder ejection fraction was determined. At 60 min, normal ejection fraction is greater than 33%. RADIOPHARMACEUTICALS:  5.5 mCi Tc-65m  Choletec IV COMPARISON:  None. FINDINGS: Prompt uptake and biliary excretion of activity by the liver is seen. Gallbladder activity is visualized, consistent with patency of cystic duct. Biliary activity passes into small bowel, consistent with patent common bile duct. Calculated gallbladder ejection fraction is 78%. (Normal gallbladder ejection fraction with Ensure is greater than 33%.) IMPRESSION: 1.  The cystic and common bile ducts are patent. 2.  Normal gallbladder ejection fraction of 78%. Electronically Signed   By: Lauralyn Primes M.D.   On: 09/21/2018 13:29    Microbiology: Recent Results (from the past 240 hour(s))  Blood culture (routine x 2)     Status: None (Preliminary result)   Collection Time: 09/20/18 11:16 PM  Result Value Ref Range Status   Specimen Description   Final    BLOOD BLOOD RIGHT WRIST Performed  at [facility], [address] [place], [place] [address]    Special Requests   Final    BOTTLES DRAWN AEROBIC ONLY Blood Culture adequate volume Performed at [facility], [address] [place], [place] [address]    Culture   Final    NO GROWTH 2 DAYS Performed at [facility], [address] [place], [place] [address]    Report Status PENDING  Incomplete  Blood culture (routine x 2)     Status: None (Preliminary result)   Collection Time: 09/20/18 11:21 PM  Result Value Ref  Range Status   Specimen Description   Final    BLOOD BLOOD RIGHT FOREARM Performed at [facility], [address] [place], [place] [address]    Special Requests   Final    BOTTLES DRAWN AEROBIC ONLY Blood Culture results may not be optimal due to an inadequate volume of blood received in culture bottles Performed at [facility], [address], [place], [place] [address]    Culture   Final    NO GROWTH 2 DAYS Performed at [facility], [address] [place], [place] [address]    Report Status PENDING  Incomplete  Urine culture     Status: None   Collection Time: 09/21/18  1:43 AM  Result Value Ref Range Status   Specimen Description   Final    URINE, CLEAN CATCH Performed at [facility], [address], [place], [place] [address]    Special Requests   Final    Normal Performed at [facility], [address], [place], [place] [address]    Culture   Final    NO GROWTH Performed at [facility], [address] [place], [place] [address]    Report Status 09/22/2018 FINAL  Final     Labs: Basic Metabolic Panel: Recent Labs  Lab 09/20/18 2256 09/21/18 0608 09/22/18 0432 09/23/18 0405  NA 139 139 139 141  K 3.9 4.1 3.5 3.3*  CL 100 107 111 113*  CO2 26 23 22 24   GLUCOSE 133* 97 93 92  BUN 50* 48* 25* 16  CREATININE 2.57* 2.34* 1.15* 0.94  CALCIUM 9.9 8.6* 8.2* 8.3*   Liver Function Tests: Recent Labs  Lab 09/20/18 2256 09/21/18 0608 09/22/18 0432 09/23/18 0405  AST 32 26 17 17   ALT 19 18 12 12   ALKPHOS 66 58 54 53  BILITOT 0.5 0.5 0.6 0.2*  PROT 7.2 6.2* 5.4* 5.4*  ALBUMIN 3.5 3.0* 2.6* 2.6*   No results for input(s): LIPASE, AMYLASE in the last 168 hours. No results for input(s): AMMONIA in the last 168 hours. CBC: Recent Labs  Lab 09/20/18 2256 09/21/18 0608 09/22/18 0432 09/23/18 0405  WBC 11.9* 9.5 7.6 6.6  NEUTROABS 9.6* 7.3  --   --   HGB 13.7 12.0  9.8* 9.9*  HCT 42.9 37.7 31.3* 31.0*  MCV 96.8 97.7 100.3* 98.1  PLT 362 278 218 229   Cardiac Enzymes: No results for input(s): CKTOTAL, CKMB, CKMBINDEX, TROPONINI in the last 168 hours. BNP: BNP (last 3 results) No results for input(s): BNP in the last 8760 hours.  ProBNP (last 3 results) No results for input(s): PROBNP in the last 8760 hours.  CBG: No results for input(s): GLUCAP in the last 168 hours.     Signed:  Darlin Drop, MD [facility] 09/23/2018, 2:44 PM

## 2018-09-23 NOTE — Progress Notes (Signed)
Physical Therapy Treatment Patient Details Name: Maria George MRN: [id number] DOB: [birth year] Today's Date: 09/23/2018    History of Present Illness 78 yo female admitted with N/V/D, near syncope, AFT, CT (+) gallstones. Hx of recent bunionectomy 09/14/18, OA    PT Comments    Progressing slowly with mobility. Pt c/o not feeling well today after eating breakfast. Discussed d/c plan again on today-pt stated she will consider staying with her daughter if she can't get anyone to be with her at home.    Follow Up Recommendations  Home health PT;Supervision/Assistance - 24 hour     Equipment Recommendations  None recommended by PT    Recommendations for Other Services       Precautions / Restrictions Precautions Precautions: Fall Required Braces or Orthoses: Other Brace Other Brace: post op shoe Restrictions Weight Bearing Restrictions: Yes RLE Weight Bearing: Weight bearing as tolerated Other Position/Activity Restrictions: through heel with post op shoe    Mobility  Bed Mobility Overal bed mobility: Needs Assistance Bed Mobility: Sit to Supine       Sit to supine: Supervision;HOB elevated   General bed mobility comments: for safety, lines.   Transfers Overall transfer level: Needs assistance Equipment used: Rolling walker (2 wheeled) Transfers: Sit to/from Stand Sit to Stand: Supervision         General transfer comment: for safety.   Ambulation/Gait Ambulation/Gait assistance: Supervision Gait Distance (Feet): 15 Feet Assistive device: Rolling walker (2 wheeled) Gait Pattern/deviations: Step-through pattern;Decreased stride length     General Gait Details: Ambulation in room only on today. Pt walked ~15 feet with a RW. No LOB with RW use.    Stairs             Wheelchair Mobility    Modified Rankin (Stroke Patients Only)       Balance           Standing balance support: Bilateral upper extremity supported Standing balance-Leahy  Scale: Poor                              Cognition Arousal/Alertness: Awake/alert Behavior During Therapy: WFL for tasks assessed/performed Overall Cognitive Status: Within Functional Limits for tasks assessed                                        Exercises      General Comments        Pertinent Vitals/Pain Pain Assessment: Faces Faces Pain Scale: Hurts little more Pain Location: R foot Pain Descriptors / Indicators: Discomfort;Aching Pain Intervention(s): Monitored during session;Repositioned    Home Living                      Prior Function            PT Goals (current goals can now be found in the care plan section) Progress towards PT goals: Progressing toward goals    Frequency    Min 3X/week      PT Plan Current plan remains appropriate    Co-evaluation              AM-PAC PT "6 Clicks" Mobility   Outcome Measure  Help needed turning from your back to your side while in a flat bed without using bedrails?: A Little Help needed moving from lying on your back to sitting on  the side of a flat bed without using bedrails?: A Little Help needed moving to and from a bed to a chair (including a wheelchair)?: A Little Help needed standing up from a chair using your arms (e.g., wheelchair or bedside chair)?: A Little Help needed to walk in hospital room?: A Little Help needed climbing 3-5 steps with a railing? : A Little 6 Click Score: 18    End of Session Equipment Utilized During Treatment: Other (comment)(post op shoe) Activity Tolerance: Patient tolerated treatment well Patient left: in bed;with call bell/phone within reach;with bed alarm set   PT Visit Diagnosis: Unsteadiness on feet (R26.81);Muscle weakness (generalized) (M62.81);Other abnormalities of gait and mobility (R26.89);Difficulty in walking, not elsewhere classified (R26.2)     Time: 0076-2263 PT Time Calculation (min) (ACUTE ONLY): 44  min  Charges:  $Gait Training: 23-37 mins                        Rebeca Alert, PT Acute Rehabilitation Services Pager: [phone] Office: [phone]

## 2018-09-26 LAB — CULTURE, BLOOD (ROUTINE X 2)
Culture: NO GROWTH
Culture: NO GROWTH
Special Requests: ADEQUATE

## 2018-12-23 ENCOUNTER — Encounter: Payer: Self-pay | Admitting: Gastroenterology

## 2019-01-28 ENCOUNTER — Other Ambulatory Visit: Payer: Self-pay | Admitting: Student

## 2019-01-28 DIAGNOSIS — M79671 Pain in right foot: Secondary | ICD-10-CM

## 2019-02-21 ENCOUNTER — Other Ambulatory Visit: Payer: Medicare Other

## 2019-02-24 ENCOUNTER — Ambulatory Visit
Admission: RE | Admit: 2019-02-24 | Discharge: 2019-02-24 | Disposition: A | Discharge status: Home / Self Care | Payer: Medicare Other | Source: Ambulatory Visit | Attending: Student | Admitting: Student

## 2019-02-24 DIAGNOSIS — M79671 Pain in right foot: Secondary | ICD-10-CM

## 2019-12-17 DIAGNOSIS — I219 Acute myocardial infarction, unspecified: Secondary | ICD-10-CM

## 2019-12-17 HISTORY — DX: Acute myocardial infarction, unspecified: I21.9

## 2020-04-11 ENCOUNTER — Encounter (HOSPITAL_COMMUNITY): Payer: Self-pay

## 2020-04-11 ENCOUNTER — Encounter (HOSPITAL_COMMUNITY)
Admission: RE | Admit: 2020-04-11 | Discharge: 2020-04-11 | Disposition: A | Discharge status: Home / Self Care | Payer: Medicare PPO | Source: Ambulatory Visit | Attending: Internal Medicine | Admitting: Internal Medicine

## 2020-04-11 ENCOUNTER — Encounter (HOSPITAL_COMMUNITY): Payer: Medicare PPO

## 2020-04-11 ENCOUNTER — Other Ambulatory Visit: Payer: Self-pay

## 2020-04-11 VITALS — BP 126/70 | HR 90 | Ht 62.0 in | Wt 165.8 lb

## 2020-04-11 DIAGNOSIS — I2102 ST elevation (STEMI) myocardial infarction involving left anterior descending coronary artery: Secondary | ICD-10-CM | POA: Insufficient documentation

## 2020-04-11 DIAGNOSIS — Z955 Presence of coronary angioplasty implant and graft: Secondary | ICD-10-CM

## 2020-04-11 NOTE — Progress Notes (Signed)
Cardiac/Pulmonary Rehab Medication Review by a Pharmacist  Does the patient  feel that his/her medications are working for him/her?  Patient states having hot flashes, runny nose, and possibly combination of depression/anxiety  Has the patient been experiencing any side effects to the medications prescribed?  no  Does the patient measure his/her own blood pressure or blood glucose at home?    Does the patient have any problems obtaining medications due to transportation or finances?   no  Understanding of regimen: fair Understanding of indications: fair Potential of compliance: fair  Questions asked to Determine Patient Understanding of Medication Regimen:  1. What is the name of the medication?  2. What is the medication used for?  3. When should it be taken?  4. How much should be taken?  5. How will you take it?  6. What side effects should you report?  Understanding Defined as: Excellent: All questions above are correct Good: Questions 1-4 are correct Fair: Questions 1-2 are correct  Poor: 1 or none of the above questions are correct   Pharmacist comments: Overall, patient seems to be compliant with medications as she has prefilled packaging.  She says she has a follow-up with neurology in September due to issues she is having. Talked to her about her runny nose which she says was put on Zyrtec but not helping.  Discussed about alternative antihistamines- she said Benadryl used to work but MD at [facility] told her not to take it.  Patient is on venlafaxine for depression but says she still feels a mixture of depression/anxiety but unsure why she was taken off anxiety medications.  Patient says she will follow-up with PCP to address some of these issues.    Tad Moore 04/11/2020 2:33 PM

## 2020-04-11 NOTE — Progress Notes (Signed)
Daily Session Note  Patient Details  Name: Maria George MRN: [id number] Date of Birth: June 21, 1941 Referring Provider:    Encounter Date: 04/11/2020  Check In:  Session Check In - 04/11/20 1331      Check-In   Supervising physician immediately available to respond to emergencies See telemetry face sheet for immediately available MD    Location [facility]    Staff Present Maurice Small, RN, Jennye Moccasin, RN, BSN    Virtual Visit No    Medication changes reported     No    Fall or balance concerns reported    No    Tobacco Cessation No Change    Warm-up and Cool-down Performed as group-led instruction    Resistance Training Performed Yes    VAD Patient? No    PAD/SET Patient? No      Pain Assessment   Currently in Pain? No/denies           Capillary Blood Glucose: No results found for this or any previous visit (from the past 24 hour(s)).    Social History   Tobacco Use  Smoking Status Former Smoker  . Packs/day: 0.25  . Years: 10.00  . Pack years: 2.50  . Quit date: 08/18/1996  . Years since quitting: 23.6  Smokeless Tobacco Never Used    Goals Met:  Exercise tolerated well Personal goals reviewed No report of cardiac concerns or symptoms Strength training completed today  Goals Unmet:  Not Applicable  Comments: Patient's orientation visit today 12:30-14:15.   Dr. Kathie Dike is Medical Director for [facility].

## 2020-04-11 NOTE — Progress Notes (Signed)
Cardiac Individual Treatment Plan  Patient Details  Name: Maria George MRN: [id number] Date of Birth: [birth year] Referring Provider:     CARDIAC REHAB PHASE II ORIENTATION from 04/11/2020 in [facility] CARDIAC REHABILITATION  Referring Provider Dr. Adella Hare      Initial Encounter Date:    CARDIAC REHAB PHASE II ORIENTATION from 04/11/2020 in [facility] CARDIAC REHABILITATION  Date 04/11/20      Visit Diagnosis: ST elevation myocardial infarction involving left anterior descending (LAD) coronary artery (HCC)  S/P coronary artery stent placement  Patient's Home Medications on Admission:  Current Outpatient Medications:  .  aspirin EC 81 MG tablet, Take 81 mg by mouth daily. Swallow whole., Disp: , Rfl:  .  atorvastatin (LIPITOR) 80 MG tablet, Take 80 mg by mouth daily., Disp: , Rfl:  .  cetirizine (ZYRTEC) 10 MG tablet, Take 10 mg by mouth daily., Disp: , Rfl:  .  clopidogrel (PLAVIX) 75 MG tablet, Take 75 mg by mouth daily., Disp: , Rfl:  .  estradiol (ESTRACE) 1 MG tablet, Take 1 mg by mouth daily., Disp: , Rfl:  .  lisinopril (ZESTRIL) 5 MG tablet, Take 5 mg by mouth 2 (two) times daily., Disp: , Rfl:  .  loperamide (IMODIUM) 2 MG capsule, Take 2 mg by mouth as needed for diarrhea or loose stools., Disp: , Rfl:  .  metoprolol succinate (TOPROL-XL) 25 MG 24 hr tablet, Take 25 mg by mouth daily., Disp: , Rfl:  .  ondansetron (ZOFRAN) 4 MG tablet, Take 4 mg by mouth daily as needed for nausea or vomiting., Disp: , Rfl:  .  amLODipine (NORVASC) 5 MG tablet, Take 1 tablet (5 mg total) by mouth daily. (Patient not taking: Reported on 04/11/2020), Disp: 30 tablet, Rfl: 0 .  clonazePAM (KLONOPIN) 0.5 MG tablet, Take 0.5 mg by mouth 2 (two) times daily as needed for anxiety. (Patient not taking: Reported on 04/11/2020), Disp: , Rfl:  .  gabapentin (NEURONTIN) 300 MG capsule, Take 600 mg by mouth 2 (two) times daily.  (Patient not taking: Reported on 04/11/2020), Disp: , Rfl:  .   hydrOXYzine (ATARAX/VISTARIL) 25 MG tablet, Take 25 mg by mouth 3 (three) times daily as needed for itching. (Patient not taking: Reported on 04/11/2020), Disp: , Rfl:  .  oxyCODONE (OXY IR/ROXICODONE) 5 MG immediate release tablet, Take 5 mg by mouth every 6 (six) hours as needed for severe pain.  (Patient not taking: Reported on 04/11/2020), Disp: , Rfl:  .  Polyethyl Glycol-Propyl Glycol (SYSTANE) 0.4-0.3 % SOLN, Apply 1 drop to eye 3 (three) times daily as needed (dry eyes). (Patient not taking: Reported on 04/11/2020), Disp: , Rfl:  .  venlafaxine (EFFEXOR-XR) 150 MG 24 hr capsule, Take 150 mg by mouth daily with breakfast. , Disp: , Rfl:  .  Vitamin D, Ergocalciferol, (DRISDOL) 1.25 MG (50000 UT) CAPS capsule, Take 50,000 Units by mouth 2 (two) times a week. Monday and Thursday (Patient not taking: Reported on 04/11/2020), Disp: , Rfl:  .  zolpidem (AMBIEN) 10 MG tablet, Take 10 mg by mouth at bedtime as needed for sleep. , Disp: , Rfl:   Past Medical History: Past Medical History:  Diagnosis Date  . Arthritis   . Depression   . GERD (gastroesophageal reflux disease)    otc  . Hypertension   . Migraine    has been yrs.  . Osteoporosis     Tobacco Use: Social History   Tobacco Use  Smoking Status Former Smoker  .  Packs/day: 0.25  . Years: 10.00  . Pack years: 2.50  . Quit date: 08/18/1996  . Years since quitting: 23.6  Smokeless Tobacco Never Used    Labs: Recent Review Contractor for [facility] Latest Ref Rng & Units 06/27/2012   TCO2 0 - 100 mmol/L 26      Capillary Blood Glucose: No results found for: GLUCAP   Exercise Target Goals: Exercise Program Goal: Individual exercise prescription set using results from initial 6 min walk test and THRR while considering  patient's activity barriers and safety.   Exercise Prescription Goal: Starting with aerobic activity 30 plus minutes a day, 3 days per week for initial exercise prescription.  Provide home exercise prescription and guidelines that participant acknowledges understanding prior to discharge.  Activity Barriers & Risk Stratification:   6 Minute Walk:  6 Minute Walk    Row Name 04/11/20 1517         6 Minute Walk   Phase Initial     Distance 550 feet     Walk Time 6 minutes     # of Rest Breaks 0     MPH 1.79     METS 1.79     RPE 15     VO2 Peak 2.7     Symptoms No     Resting HR 88 bpm     Resting BP 126/70     Resting Oxygen Saturation  96 %     Exercise Oxygen Saturation  during 6 min walk 96 %     Max Ex. HR 111 bpm     Max Ex. BP 140/90     2 Minute Post BP 140/82       Interval HR   Interval Heart Rate? --            Oxygen Initial Assessment:   Oxygen Re-Evaluation:   Oxygen Discharge (Final Oxygen Re-Evaluation):   Initial Exercise Prescription:  Initial Exercise Prescription - 04/13/20 0800      Date of Initial Exercise RX and Referring Provider   Date 04/11/20    Referring Provider Dr. Adella Hare    Expected Discharge Date 07/14/20      NuStep   Level 1    SPM 80    Minutes 39    METs 1.5      Prescription Details   Frequency (times per week) 2    Duration Progress to 30 minutes of continuous aerobic without signs/symptoms of physical distress      Intensity   THRR 40-80% of Max Heartrate 67-114    Ratings of Perceived Exertion 11-13    Perceived Dyspnea 0-4      Resistance Training   Training Prescription Yes    Weight 1    Reps 10-15           Perform Capillary Blood Glucose checks as needed.  Exercise Prescription Changes:   Exercise Comments:   Exercise Goals and Review:   Exercise Goals    Row Name 04/13/20 0827             Exercise Goals   Increase Physical Activity Yes       Intervention Provide advice, education, support and counseling about physical activity/exercise needs.;Develop an individualized exercise prescription for aerobic and resistive training based on initial  evaluation findings, risk stratification, comorbidities and participant's personal goals.       Expected Outcomes Short Term: Attend rehab on a regular basis to  increase amount of physical activity.;Long Term: Add in home exercise to make exercise part of routine and to increase amount of physical activity.;Long Term: Exercising regularly at least 3-5 days a week.       Increase Strength and Stamina Yes       Intervention Provide advice, education, support and counseling about physical activity/exercise needs.;Develop an individualized exercise prescription for aerobic and resistive training based on initial evaluation findings, risk stratification, comorbidities and participant's personal goals.       Expected Outcomes Short Term: Increase workloads from initial exercise prescription for resistance, speed, and METs.;Short Term: Perform resistance training exercises routinely during rehab and add in resistance training at home;Long Term: Improve cardiorespiratory fitness, muscular endurance and strength as measured by increased METs and functional capacity ( )       Able to understand and use rate of perceived exertion (RPE) scale Yes       Intervention Provide education and explanation on how to use RPE scale       Expected Outcomes Short Term: Able to use RPE daily in rehab to express subjective intensity level;Long Term:  Able to use RPE to guide intensity level when exercising independently       Knowledge and understanding of Target Heart Rate Range (THRR) Yes       Intervention Provide education and explanation of THRR including how the numbers were predicted and where they are located for reference       Expected Outcomes Short Term: Able to state/look up THRR;Long Term: Able to use THRR to govern intensity when exercising independently;Short Term: Able to use daily as guideline for intensity in rehab       Able to check pulse independently Yes       Intervention Provide education and  demonstration on how to check pulse in carotid and radial arteries.;Review the importance of being able to check your own pulse for safety during independent exercise       Expected Outcomes Short Term: Able to explain why pulse checking is important during independent exercise;Long Term: Able to check pulse independently and accurately       Understanding of Exercise Prescription Yes       Intervention Provide education, explanation, and written materials on patient's individual exercise prescription       Expected Outcomes Short Term: Able to explain program exercise prescription;Long Term: Able to explain home exercise prescription to exercise independently              Exercise Goals Re-Evaluation :    Discharge Exercise Prescription (Final Exercise Prescription Changes):   Nutrition:  Target Goals: Understanding of nutrition guidelines, daily intake of sodium 1500mg , cholesterol 200mg , calories 30% from fat and 7% or less from saturated fats, daily to have 5 or more servings of fruits and vegetables.  Biometrics:    Nutrition Therapy Plan and Nutrition Goals:  Nutrition Therapy & Goals - 04/11/20 1505      Personal Nutrition Goals   Comments Patient scored 40 on her medficts diet assessment score. She eats alone and says she trys to eat heart healthy. Will continue to monitor.      Intervention Plan   Intervention Nutrition handout(s) given to patient.           Nutrition Assessments:  Nutrition Assessments - 04/11/20 1515      MEDFICTS Scores   Pre Score 40           Nutrition Goals Re-Evaluation:   Nutrition Goals Discharge (Final  Nutrition Goals Re-Evaluation):   Psychosocial: Target Goals: Acknowledge presence or absence of significant depression and/or stress, maximize coping skills, provide positive support system. Participant is able to verbalize types and ability to use techniques and skills needed for reducing stress and depression.  Initial  Review & Psychosocial Screening:  Initial Psych Review & Screening - 04/11/20 1548      Initial Review   Current issues with None Identified      Family Dynamics   Good Support System? Yes    Comments Patient has no psychosocial issues identified at her orientation visit.      Barriers   Psychosocial barriers to participate in program Psychosocial barriers identified (see note)      Screening Interventions   Interventions Encouraged to exercise    Expected Outcomes Long Term goal: The participant improves quality of Life and PHQ9 Scores as seen by post scores and/or verbalization of changes           Quality of Life Scores:  Quality of Life - 04/11/20 1546      Quality of Life   Select Quality of Life      Quality of Life Scores   Health/Function Pre 23.66 %    Socioeconomic Pre 27.5 %    Psych/Spiritual Pre 24.86 %    Family Pre 28.75 %    GLOBAL Pre 25.34 %          Scores of 19 and below usually indicate a poorer quality of life in these areas.  A difference of  2-3 points is a clinically meaningful difference.  A difference of 2-3 points in the total score of the Quality of Life Index has been associated with significant improvement in overall quality of life, self-image, physical symptoms, and general health in studies assessing change in quality of life.  PHQ-9: Recent Review Flowsheet Data    Depression screen [facility] 2/9 04/11/2020   Decreased Interest 0   Down, Depressed, Hopeless 0   PHQ - 2 Score 0   Altered sleeping 1   Tired, decreased energy 1   Change in appetite 0   Feeling bad or failure about yourself  0   Trouble concentrating 0   Moving slowly or fidgety/restless 0   Suicidal thoughts 0   PHQ-9 Score 2   Difficult doing work/chores Not difficult at all     Interpretation of Total Score  Total Score Depression Severity:  1-4 = Minimal depression, 5-9 = Mild depression, 10-14 = Moderate depression, 15-19 = Moderately severe depression, 20-27 =  Severe depression   Psychosocial Evaluation and Intervention:  Psychosocial Evaluation - 04/11/20 1549      Psychosocial Evaluation & Interventions   Interventions Stress management education;Relaxation education;Encouraged to exercise with the program and follow exercise prescription    Comments Patient's initial QOL socre was 25.34 and her PHQ-9 score was 2. She lives alone. She has a daughter and a son and several grandchildren. She scored very high in her family QOL section. She says he family is very important to her. Will continue to monitor for progress.    Expected Outcomes Patient will have no psychosocial issues identified at discharge.    Continue Psychosocial Services  No Follow up required           Psychosocial Re-Evaluation:   Psychosocial Discharge (Final Psychosocial Re-Evaluation):   Vocational Rehabilitation: Provide vocational rehab assistance to qualifying candidates.   Vocational Rehab Evaluation & Intervention:  Vocational Rehab - 04/11/20 1506  Initial Vocational Rehab Evaluation & Intervention   Assessment shows need for Vocational Rehabilitation No      Vocational Rehab Re-Evaulation   Comments Patient is retired and does not need vocational rehab.           Education: Education Goals: Education classes will be provided on a weekly basis, covering required topics. Participant will state understanding/return demonstration of topics presented.  Learning Barriers/Preferences:  Learning Barriers/Preferences - 04/11/20 1506      Learning Barriers/Preferences   Learning Barriers None    Learning Preferences Written Material           Education Topics: Hypertension, Hypertension Reduction -Define heart disease and high blood pressure. Discus how high blood pressure affects the body and ways to reduce high blood pressure.   Exercise and Your Heart -Discuss why it is important to exercise, the FITT principles of exercise, normal and  abnormal responses to exercise, and how to exercise safely.   Angina -Discuss definition of angina, causes of angina, treatment of angina, and how to decrease risk of having angina.   Cardiac Medications -Review what the following cardiac medications are used for, how they affect the body, and side effects that may occur when taking the medications.  Medications include Aspirin, Beta blockers, calcium channel blockers, ACE Inhibitors, angiotensin receptor blockers, diuretics, digoxin, and antihyperlipidemics.   Congestive Heart Failure -Discuss the definition of CHF, how to live with CHF, the signs and symptoms of CHF, and how keep track of weight and sodium intake.   Heart Disease and Intimacy -Discus the effect sexual activity has on the heart, how changes occur during intimacy as we age, and safety during sexual activity.   Smoking Cessation / COPD -Discuss different methods to quit smoking, the health benefits of quitting smoking, and the definition of COPD.   Nutrition I: Fats -Discuss the types of cholesterol, what cholesterol does to the heart, and how cholesterol levels can be controlled.   Nutrition II: Labels -Discuss the different components of food labels and how to read food label   Heart Parts/Heart Disease and PAD -Discuss the anatomy of the heart, the pathway of blood circulation through the heart, and these are affected by heart disease.   Stress I: Signs and Symptoms -Discuss the causes of stress, how stress may lead to anxiety and depression, and ways to limit stress.   Stress II: Relaxation -Discuss different types of relaxation techniques to limit stress.   Warning Signs of Stroke / TIA -Discuss definition of a stroke, what the signs and symptoms are of a stroke, and how to identify when someone is having stroke.   Knowledge Questionnaire Score:  Knowledge Questionnaire Score - 04/11/20 1506      Knowledge Questionnaire Score   Pre Score 20/24            Core Components/Risk Factors/Patient Goals at Admission:  Personal Goals and Risk Factors at Admission - 04/11/20 1515      Core Components/Risk Factors/Patient Goals on Admission   Personal Goal Other Yes    Personal Goal Get heart stronger.    Intervention Patient will attend CR 2 days/week and supplement with exercise at home 3 days/week.    Expected Outcomes Patient will complete 18 sessions meeting both program and personal goals.           Core Components/Risk Factors/Patient Goals Review:    Core Components/Risk Factors/Patient Goals at Discharge (Final Review):    ITP Comments:   Comments: Patient arrived for 1st  visit/orientation/education at 1230. Patient was referred to CR by Dr. Onalee Hua due to STEMI (I21.02) and S/P Coronary Artery Stent Placement (Z95.5). During orientation advised patient on arrival and appointment times what to wear, what to do before, during and after exercise. Reviewed attendance and class policy. Talked about inclement weather and class consultation policy. Pt is scheduled to return Cardiac Rehab on 04/18/20 at 11:00. Pt was advised to come to class 15 minutes before class starts. Patient was also given instructions on meeting with the dietician and attending the Family Structure classes. Discussed RPE/Dpysnea scales. Discussed initial THR and how to find their radial and/or carotid pulse. Discussed the initial exercise prescription and how this effects their progress. Pt is eager to get started. Patient participated in warm up stretches followed by light weights and resistance bands. Patient was able to complete 6 minute walk test.  Patient was measured for the equipment. Discussed equipment safety with patient. Took patient pre-anthropometric measurements. Patient finished visit at 1415.

## 2020-04-18 ENCOUNTER — Other Ambulatory Visit: Payer: Self-pay

## 2020-04-18 ENCOUNTER — Encounter (HOSPITAL_COMMUNITY)
Admission: RE | Admit: 2020-04-18 | Discharge: 2020-04-18 | Disposition: A | Discharge status: Home / Self Care | Payer: Medicare PPO | Source: Ambulatory Visit | Attending: Internal Medicine | Admitting: Internal Medicine

## 2020-04-18 DIAGNOSIS — Z955 Presence of coronary angioplasty implant and graft: Secondary | ICD-10-CM

## 2020-04-18 DIAGNOSIS — I2102 ST elevation (STEMI) myocardial infarction involving left anterior descending coronary artery: Secondary | ICD-10-CM | POA: Insufficient documentation

## 2020-04-18 NOTE — Progress Notes (Signed)
Daily Session Note  Patient Details  Name: AHMANI DAOUD MRN: [id number] Date of Birth: 1941-07-01 Referring Provider:     CARDIAC REHAB PHASE II ORIENTATION from 04/11/2020 in [facility]  Referring Provider Dr. Metta Clines      Encounter Date: 04/18/2020  Check In:  Session Check In - 04/18/20 1100      Check-In   Supervising physician immediately available to respond to emergencies See telemetry face sheet for immediately available MD    Location [facility]    Staff Present Aundra Dubin, RN, Bjorn Loser, MS, ACSM-CEP, Exercise Physiologist    Virtual Visit No    Medication changes reported     No    Fall or balance concerns reported    No    Tobacco Cessation No Change    Warm-up and Cool-down Performed as group-led instruction    Resistance Training Performed Yes    VAD Patient? No    PAD/SET Patient? No      Pain Assessment   Currently in Pain? No/denies    Pain Score 0-No pain    Multiple Pain Sites No           Capillary Blood Glucose: No results found for this or any previous visit (from the past 24 hour(s)).    Social History   Tobacco Use  Smoking Status Former Smoker  . Packs/day: 0.25  . Years: 10.00  . Pack years: 2.50  . Quit date: 08/18/1996  . Years since quitting: 23.6  Smokeless Tobacco Never Used    Goals Met:  Independence with exercise equipment Exercise tolerated well No report of cardiac concerns or symptoms Strength training completed today  Goals Unmet:  Not Applicable  Comments: Check out 1200.   Dr. Kathie Dike is Medical Director for [facility].

## 2020-04-18 NOTE — Progress Notes (Signed)
Cardiac Individual Treatment Plan  Patient Details  Name: Maria George MRN: [id number] Date of Birth: 31-Mar-1941 Referring Provider:     CARDIAC REHAB PHASE II ORIENTATION from 04/11/2020 in [facility] CARDIAC REHABILITATION  Referring Provider Dr. Adella Hare      Initial Encounter Date:    CARDIAC REHAB PHASE II ORIENTATION from 04/11/2020 in [facility] CARDIAC REHABILITATION  Date 04/11/20      Visit Diagnosis: ST elevation myocardial infarction involving left anterior descending (LAD) coronary artery (HCC)  S/P coronary artery stent placement  Patient's Home Medications on Admission:  Current Outpatient Medications:  .  amLODipine (NORVASC) 5 MG tablet, Take 1 tablet (5 mg total) by mouth daily. (Patient not taking: Reported on 04/11/2020), Disp: 30 tablet, Rfl: 0 .  aspirin EC 81 MG tablet, Take 81 mg by mouth daily. Swallow whole., Disp: , Rfl:  .  atorvastatin (LIPITOR) 80 MG tablet, Take 80 mg by mouth daily., Disp: , Rfl:  .  cetirizine (ZYRTEC) 10 MG tablet, Take 10 mg by mouth daily., Disp: , Rfl:  .  clonazePAM (KLONOPIN) 0.5 MG tablet, Take 0.5 mg by mouth 2 (two) times daily as needed for anxiety. (Patient not taking: Reported on 04/11/2020), Disp: , Rfl:  .  clopidogrel (PLAVIX) 75 MG tablet, Take 75 mg by mouth daily., Disp: , Rfl:  .  estradiol (ESTRACE) 1 MG tablet, Take 1 mg by mouth daily., Disp: , Rfl:  .  gabapentin (NEURONTIN) 300 MG capsule, Take 600 mg by mouth 2 (two) times daily.  (Patient not taking: Reported on 04/11/2020), Disp: , Rfl:  .  hydrOXYzine (ATARAX/VISTARIL) 25 MG tablet, Take 25 mg by mouth 3 (three) times daily as needed for itching. (Patient not taking: Reported on 04/11/2020), Disp: , Rfl:  .  lisinopril (ZESTRIL) 5 MG tablet, Take 5 mg by mouth 2 (two) times daily., Disp: , Rfl:  .  loperamide (IMODIUM) 2 MG capsule, Take 2 mg by mouth as needed for diarrhea or loose stools., Disp: , Rfl:  .  metoprolol succinate (TOPROL-XL) 25 MG 24 hr  tablet, Take 25 mg by mouth daily., Disp: , Rfl:  .  ondansetron (ZOFRAN) 4 MG tablet, Take 4 mg by mouth daily as needed for nausea or vomiting., Disp: , Rfl:  .  oxyCODONE (OXY IR/ROXICODONE) 5 MG immediate release tablet, Take 5 mg by mouth every 6 (six) hours as needed for severe pain.  (Patient not taking: Reported on 04/11/2020), Disp: , Rfl:  .  Polyethyl Glycol-Propyl Glycol (SYSTANE) 0.4-0.3 % SOLN, Apply 1 drop to eye 3 (three) times daily as needed (dry eyes). (Patient not taking: Reported on 04/11/2020), Disp: , Rfl:  .  venlafaxine (EFFEXOR-XR) 150 MG 24 hr capsule, Take 150 mg by mouth daily with breakfast. , Disp: , Rfl:  .  Vitamin D, Ergocalciferol, (DRISDOL) 1.25 MG (50000 UT) CAPS capsule, Take 50,000 Units by mouth 2 (two) times a week. Monday and Thursday (Patient not taking: Reported on 04/11/2020), Disp: , Rfl:  .  zolpidem (AMBIEN) 10 MG tablet, Take 10 mg by mouth at bedtime as needed for sleep. , Disp: , Rfl:   Past Medical History: Past Medical History:  Diagnosis Date  . Arthritis   . Depression   . GERD (gastroesophageal reflux disease)    otc  . Hypertension   . Migraine    has been yrs.  . Osteoporosis     Tobacco Use: Social History   Tobacco Use  Smoking Status Former Smoker  .  Packs/day: 0.25  . Years: 10.00  . Pack years: 2.50  . Quit date: 08/18/1996  . Years since quitting: 23.6  Smokeless Tobacco Never Used    Labs: Recent Review Contractor for [facility] Latest Ref Rng & Units 06/27/2012   TCO2 0 - 100 mmol/L 26      Capillary Blood Glucose: No results found for: GLUCAP   Exercise Target Goals: Exercise Program Goal: Individual exercise prescription set using results from initial 6 min walk test and THRR while considering  patient's activity barriers and safety.   Exercise Prescription Goal: Starting with aerobic activity 30 plus minutes a day, 3 days per week for initial exercise prescription.  Provide home exercise prescription and guidelines that participant acknowledges understanding prior to discharge.  Activity Barriers & Risk Stratification:   6 Minute Walk:  6 Minute Walk    Row Name 04/11/20 1517         6 Minute Walk   Phase Initial     Distance 550 feet     Walk Time 6 minutes     # of Rest Breaks 0     MPH 1.79     METS 1.79     RPE 15     VO2 Peak 2.7     Symptoms No     Resting HR 88 bpm     Resting BP 126/70     Resting Oxygen Saturation  96 %     Exercise Oxygen Saturation  during 6 min walk 96 %     Max Ex. HR 111 bpm     Max Ex. BP 140/90     2 Minute Post BP 140/82       Interval HR   Interval Heart Rate? --            Oxygen Initial Assessment:   Oxygen Re-Evaluation:   Oxygen Discharge (Final Oxygen Re-Evaluation):   Initial Exercise Prescription:  Initial Exercise Prescription - 04/13/20 0800      Date of Initial Exercise RX and Referring Provider   Date 04/11/20    Referring Provider Dr. Adella Hare    Expected Discharge Date 07/14/20      NuStep   Level 1    SPM 80    Minutes 39    METs 1.5      Prescription Details   Frequency (times per week) 2    Duration Progress to 30 minutes of continuous aerobic without signs/symptoms of physical distress      Intensity   THRR 40-80% of Max Heartrate 67-114    Ratings of Perceived Exertion 11-13    Perceived Dyspnea 0-4      Resistance Training   Training Prescription Yes    Weight 1    Reps 10-15           Perform Capillary Blood Glucose checks as needed.  Exercise Prescription Changes:   Exercise Comments:   Exercise Goals and Review:   Exercise Goals    Row Name 04/13/20 0827             Exercise Goals   Increase Physical Activity Yes       Intervention Provide advice, education, support and counseling about physical activity/exercise needs.;Develop an individualized exercise prescription for aerobic and resistive training based on initial  evaluation findings, risk stratification, comorbidities and participant's personal goals.       Expected Outcomes Short Term: Attend rehab on a regular basis to  increase amount of physical activity.;Long Term: Add in home exercise to make exercise part of routine and to increase amount of physical activity.;Long Term: Exercising regularly at least 3-5 days a week.       Increase Strength and Stamina Yes       Intervention Provide advice, education, support and counseling about physical activity/exercise needs.;Develop an individualized exercise prescription for aerobic and resistive training based on initial evaluation findings, risk stratification, comorbidities and participant's personal goals.       Expected Outcomes Short Term: Increase workloads from initial exercise prescription for resistance, speed, and METs.;Short Term: Perform resistance training exercises routinely during rehab and add in resistance training at home;Long Term: Improve cardiorespiratory fitness, muscular endurance and strength as measured by increased METs and functional capacity ( )       Able to understand and use rate of perceived exertion (RPE) scale Yes       Intervention Provide education and explanation on how to use RPE scale       Expected Outcomes Short Term: Able to use RPE daily in rehab to express subjective intensity level;Long Term:  Able to use RPE to guide intensity level when exercising independently       Knowledge and understanding of Target Heart Rate Range (THRR) Yes       Intervention Provide education and explanation of THRR including how the numbers were predicted and where they are located for reference       Expected Outcomes Short Term: Able to state/look up THRR;Long Term: Able to use THRR to govern intensity when exercising independently;Short Term: Able to use daily as guideline for intensity in rehab       Able to check pulse independently Yes       Intervention Provide education and  demonstration on how to check pulse in carotid and radial arteries.;Review the importance of being able to check your own pulse for safety during independent exercise       Expected Outcomes Short Term: Able to explain why pulse checking is important during independent exercise;Long Term: Able to check pulse independently and accurately       Understanding of Exercise Prescription Yes       Intervention Provide education, explanation, and written materials on patient's individual exercise prescription       Expected Outcomes Short Term: Able to explain program exercise prescription;Long Term: Able to explain home exercise prescription to exercise independently              Exercise Goals Re-Evaluation :    Discharge Exercise Prescription (Final Exercise Prescription Changes):   Nutrition:  Target Goals: Understanding of nutrition guidelines, daily intake of sodium 1500mg , cholesterol 200mg , calories 30% from fat and 7% or less from saturated fats, daily to have 5 or more servings of fruits and vegetables.  Biometrics:    Nutrition Therapy Plan and Nutrition Goals:  Nutrition Therapy & Goals - 04/11/20 1505      Personal Nutrition Goals   Comments Patient scored 40 on her medficts diet assessment score. She eats alone and says she trys to eat heart healthy. Will continue to monitor.      Intervention Plan   Intervention Nutrition handout(s) given to patient.           Nutrition Assessments:  Nutrition Assessments - 04/11/20 1515      MEDFICTS Scores   Pre Score 40           Nutrition Goals Re-Evaluation:   Nutrition Goals Discharge (Final  Nutrition Goals Re-Evaluation):   Psychosocial: Target Goals: Acknowledge presence or absence of significant depression and/or stress, maximize coping skills, provide positive support system. Participant is able to verbalize types and ability to use techniques and skills needed for reducing stress and depression.  Initial  Review & Psychosocial Screening:  Initial Psych Review & Screening - 04/11/20 1548      Initial Review   Current issues with None Identified      Family Dynamics   Good Support System? Yes    Comments Patient has no psychosocial issues identified at her orientation visit.      Barriers   Psychosocial barriers to participate in program Psychosocial barriers identified (see note)      Screening Interventions   Interventions Encouraged to exercise    Expected Outcomes Long Term goal: The participant improves quality of Life and PHQ9 Scores as seen by post scores and/or verbalization of changes           Quality of Life Scores:  Quality of Life - 04/11/20 1546      Quality of Life   Select Quality of Life      Quality of Life Scores   Health/Function Pre 23.66 %    Socioeconomic Pre 27.5 %    Psych/Spiritual Pre 24.86 %    Family Pre 28.75 %    GLOBAL Pre 25.34 %          Scores of 19 and below usually indicate a poorer quality of life in these areas.  A difference of  2-3 points is a clinically meaningful difference.  A difference of 2-3 points in the total score of the Quality of Life Index has been associated with significant improvement in overall quality of life, self-image, physical symptoms, and general health in studies assessing change in quality of life.  PHQ-9: Recent Review Flowsheet Data    Depression screen [facility] 2/9 04/11/2020   Decreased Interest 0   Down, Depressed, Hopeless 0   PHQ - 2 Score 0   Altered sleeping 1   Tired, decreased energy 1   Change in appetite 0   Feeling bad or failure about yourself  0   Trouble concentrating 0   Moving slowly or fidgety/restless 0   Suicidal thoughts 0   PHQ-9 Score 2   Difficult doing work/chores Not difficult at all     Interpretation of Total Score  Total Score Depression Severity:  1-4 = Minimal depression, 5-9 = Mild depression, 10-14 = Moderate depression, 15-19 = Moderately severe depression, 20-27 =  Severe depression   Psychosocial Evaluation and Intervention:  Psychosocial Evaluation - 04/11/20 1549      Psychosocial Evaluation & Interventions   Interventions Stress management education;Relaxation education;Encouraged to exercise with the program and follow exercise prescription    Comments Patient's initial QOL socre was 25.34 and her PHQ-9 score was 2. She lives alone. She has a daughter and a son and several grandchildren. She scored very high in her family QOL section. She says he family is very important to her. Will continue to monitor for progress.    Expected Outcomes Patient will have no psychosocial issues identified at discharge.    Continue Psychosocial Services  No Follow up required           Psychosocial Re-Evaluation:  Psychosocial Re-Evaluation    Row Name 04/16/20 1533             Psychosocial Re-Evaluation   Current issues with None Identified  Comments Patient's initial QOL score was 25.34% and her PHQ-9 score was 2 with no psychosocial issues identified.       Expected Outcomes Patient will have no psychosocial issues identified at discharge.       Interventions Stress management education;Encouraged to attend Cardiac Rehabilitation for the exercise;Relaxation education       Continue Psychosocial Services  No Follow up required              Psychosocial Discharge (Final Psychosocial Re-Evaluation):  Psychosocial Re-Evaluation - 04/16/20 1533      Psychosocial Re-Evaluation   Current issues with None Identified    Comments Patient's initial QOL score was 25.34% and her PHQ-9 score was 2 with no psychosocial issues identified.    Expected Outcomes Patient will have no psychosocial issues identified at discharge.    Interventions Stress management education;Encouraged to attend Cardiac Rehabilitation for the exercise;Relaxation education    Continue Psychosocial Services  No Follow up required           Vocational Rehabilitation: Provide  vocational rehab assistance to qualifying candidates.   Vocational Rehab Evaluation & Intervention:  Vocational Rehab - 04/11/20 1506      Initial Vocational Rehab Evaluation & Intervention   Assessment shows need for Vocational Rehabilitation No      Vocational Rehab Re-Evaulation   Comments Patient is retired and does not need vocational rehab.           Education: Education Goals: Education classes will be provided on a weekly basis, covering required topics. Participant will state understanding/return demonstration of topics presented.  Learning Barriers/Preferences:  Learning Barriers/Preferences - 04/11/20 1506      Learning Barriers/Preferences   Learning Barriers None    Learning Preferences Written Material           Education Topics: Hypertension, Hypertension Reduction -Define heart disease and high blood pressure. Discus how high blood pressure affects the body and ways to reduce high blood pressure.   Exercise and Your Heart -Discuss why it is important to exercise, the FITT principles of exercise, normal and abnormal responses to exercise, and how to exercise safely.   Angina -Discuss definition of angina, causes of angina, treatment of angina, and how to decrease risk of having angina.   Cardiac Medications -Review what the following cardiac medications are used for, how they affect the body, and side effects that may occur when taking the medications.  Medications include Aspirin, Beta blockers, calcium channel blockers, ACE Inhibitors, angiotensin receptor blockers, diuretics, digoxin, and antihyperlipidemics.   Congestive Heart Failure -Discuss the definition of CHF, how to live with CHF, the signs and symptoms of CHF, and how keep track of weight and sodium intake.   Heart Disease and Intimacy -Discus the effect sexual activity has on the heart, how changes occur during intimacy as we age, and safety during sexual activity.   Smoking Cessation  / COPD -Discuss different methods to quit smoking, the health benefits of quitting smoking, and the definition of COPD.   Nutrition I: Fats -Discuss the types of cholesterol, what cholesterol does to the heart, and how cholesterol levels can be controlled.   Nutrition II: Labels -Discuss the different components of food labels and how to read food label   Heart Parts/Heart Disease and PAD -Discuss the anatomy of the heart, the pathway of blood circulation through the heart, and these are affected by heart disease.   Stress I: Signs and Symptoms -Discuss the causes of stress, how stress may lead  to anxiety and depression, and ways to limit stress.   Stress II: Relaxation -Discuss different types of relaxation techniques to limit stress.   Warning Signs of Stroke / TIA -Discuss definition of a stroke, what the signs and symptoms are of a stroke, and how to identify when someone is having stroke.   Knowledge Questionnaire Score:  Knowledge Questionnaire Score - 04/11/20 1506      Knowledge Questionnaire Score   Pre Score 20/24           Core Components/Risk Factors/Patient Goals at Admission:  Personal Goals and Risk Factors at Admission - 04/11/20 1515      Core Components/Risk Factors/Patient Goals on Admission   Personal Goal Other Yes    Personal Goal Get heart stronger.    Intervention Patient will attend CR 2 days/week and supplement with exercise at home 3 days/week.    Expected Outcomes Patient will complete 18 sessions meeting both program and personal goals.           Core Components/Risk Factors/Patient Goals Review:   Goals and Risk Factor Review    Row Name 04/16/20 1534             Core Components/Risk Factors/Patient Goals Review   Personal Goals Review Other  Get heart stronger.       Review Patient has not started the program. Plans to start 04/18/20. Will continue to monitor for progress.       Expected Outcomes Patient will complete 18  sessions and meet both program and personal goals.              Core Components/Risk Factors/Patient Goals at Discharge (Final Review):   Goals and Risk Factor Review - 04/16/20 1534      Core Components/Risk Factors/Patient Goals Review   Personal Goals Review Other   Get heart stronger.   Review Patient has not started the program. Plans to start 04/18/20. Will continue to monitor for progress.    Expected Outcomes Patient will complete 18 sessions and meet both program and personal goals.           ITP Comments:   Comments: ITP REVIEW Pt is making expected progress toward Cardiac Rehab goals after completing 2 sessions. Recommend continued exercise, life style modification, education, and increased stamina and strength.

## 2020-04-20 ENCOUNTER — Encounter (HOSPITAL_COMMUNITY)
Admission: RE | Admit: 2020-04-20 | Discharge: 2020-04-20 | Disposition: A | Discharge status: Home / Self Care | Payer: Medicare PPO | Source: Ambulatory Visit | Attending: Internal Medicine | Admitting: Internal Medicine

## 2020-04-20 ENCOUNTER — Other Ambulatory Visit: Payer: Self-pay

## 2020-04-20 DIAGNOSIS — Z955 Presence of coronary angioplasty implant and graft: Secondary | ICD-10-CM

## 2020-04-20 DIAGNOSIS — I2102 ST elevation (STEMI) myocardial infarction involving left anterior descending coronary artery: Secondary | ICD-10-CM

## 2020-04-20 NOTE — Progress Notes (Signed)
Daily Session Note  Patient Details  Name: DENITRA DONAGHEY MRN: [id number] Date of Birth: 1941/03/14 Referring Provider:     CARDIAC REHAB PHASE II ORIENTATION from 04/11/2020 in [facility]  Referring Provider Dr. Metta Clines      Encounter Date: 04/20/2020  Check In:  Session Check In - 04/20/20 1100      Check-In   Supervising physician immediately available to respond to emergencies See telemetry face sheet for immediately available MD    Location [facility]    Staff Present Aundra Dubin, RN, Bjorn Loser, MS, ACSM-CEP, Exercise Physiologist    Virtual Visit No    Medication changes reported     No    Fall or balance concerns reported    No    Tobacco Cessation No Change    Warm-up and Cool-down Performed as group-led instruction    Resistance Training Performed Yes    VAD Patient? No    PAD/SET Patient? No      Pain Assessment   Currently in Pain? No/denies    Pain Score 0-No pain    Multiple Pain Sites No           Capillary Blood Glucose: No results found for this or any previous visit (from the past 24 hour(s)).    Social History   Tobacco Use  Smoking Status Former Smoker  . Packs/day: 0.25  . Years: 10.00  . Pack years: 2.50  . Quit date: 08/18/1996  . Years since quitting: 23.6  Smokeless Tobacco Never Used    Goals Met:  Independence with exercise equipment Exercise tolerated well No report of cardiac concerns or symptoms Strength training completed today  Goals Unmet:  Not Applicable  Comments: Check out 1200.   Dr. Kathie Dike is Medical Director for [facility].

## 2020-04-24 NOTE — Progress Notes (Signed)
Discharge Progress Report  Patient Details  Name: Maria George MRN: [id number] Date of Birth: 1940-10-26 Referring Provider:     CARDIAC REHAB PHASE II ORIENTATION from 04/11/2020 in [facility] CARDIAC REHABILITATION  Referring Provider Dr. Adella Hare       Number of Visits: 3  Reason for Discharge:  Early Exit:  Insurance  Smoking History:  Social History   Tobacco Use  Smoking Status Former Smoker  . Packs/day: 0.25  . Years: 10.00  . Pack years: 2.50  . Quit date: 08/18/1996  . Years since quitting: 23.6  Smokeless Tobacco Never Used    Diagnosis:  ST elevation myocardial infarction involving left anterior descending (LAD) coronary artery (HCC)  S/P coronary artery stent placement  ADL UCSD:   Initial Exercise Prescription:  Initial Exercise Prescription - 04/13/20 0800      Date of Initial Exercise RX and Referring Provider   Date 04/11/20    Referring Provider Dr. Adella Hare    Expected Discharge Date 07/14/20      NuStep   Level 1    SPM 80    Minutes 39    METs 1.5      Prescription Details   Frequency (times per week) 2    Duration Progress to 30 minutes of continuous aerobic without signs/symptoms of physical distress      Intensity   THRR 40-80% of Max Heartrate 67-114    Ratings of Perceived Exertion 11-13    Perceived Dyspnea 0-4      Resistance Training   Training Prescription Yes    Weight 1    Reps 10-15           Discharge Exercise Prescription (Final Exercise Prescription Changes):   Functional Capacity:  6 Minute Walk    Row Name 04/11/20 1517         6 Minute Walk   Phase Initial     Distance 550 feet     Walk Time 6 minutes     # of Rest Breaks 0     MPH 1.79     METS 1.79     RPE 15     VO2 Peak 2.7     Symptoms No     Resting HR 88 bpm     Resting BP 126/70     Resting Oxygen Saturation  96 %     Exercise Oxygen Saturation  during 6 min walk 96 %     Max Ex. HR 111 bpm     Max Ex. BP 140/90     2  Minute Post BP 140/82       Interval HR   Interval Heart Rate? --            Psychological, QOL, Others - Outcomes: PHQ 2/9: Depression screen PHQ 2/9 04/11/2020  Decreased Interest 0  Down, Depressed, Hopeless 0  PHQ - 2 Score 0  Altered sleeping 1  Tired, decreased energy 1  Change in appetite 0  Feeling bad or failure about yourself  0  Trouble concentrating 0  Moving slowly or fidgety/restless 0  Suicidal thoughts 0  PHQ-9 Score 2  Difficult doing work/chores Not difficult at all    Quality of Life:  Quality of Life - 04/11/20 1546      Quality of Life   Select Quality of Life      Quality of Life Scores   Health/Function Pre 23.66 %    Socioeconomic Pre 27.5 %    Psych/Spiritual Pre  24.86 %    Family Pre 28.75 %    GLOBAL Pre 25.34 %           Personal Goals: Goals established at orientation with interventions provided to work toward goal.  Personal Goals and Risk Factors at Admission - 04/11/20 1515      Core Components/Risk Factors/Patient Goals on Admission   Personal Goal Other Yes    Personal Goal Get heart stronger.    Intervention Patient will attend CR 2 days/week and supplement with exercise at home 3 days/week.    Expected Outcomes Patient will complete 18 sessions meeting both program and personal goals.            Personal Goals Discharge:  Goals and Risk Factor Review    Row Name 04/16/20 1534             Core Components/Risk Factors/Patient Goals Review   Personal Goals Review Other  Get heart stronger.       Review Patient has not started the program. Plans to start 04/18/20. Will continue to monitor for progress.       Expected Outcomes Patient will complete 18 sessions and meet both program and personal goals.              Exercise Goals and Review:  Exercise Goals    Row Name 04/13/20 0827             Exercise Goals   Increase Physical Activity Yes       Intervention Provide advice, education, support and  counseling about physical activity/exercise needs.;Develop an individualized exercise prescription for aerobic and resistive training based on initial evaluation findings, risk stratification, comorbidities and participant's personal goals.       Expected Outcomes Short Term: Attend rehab on a regular basis to increase amount of physical activity.;Long Term: Add in home exercise to make exercise part of routine and to increase amount of physical activity.;Long Term: Exercising regularly at least 3-5 days a week.       Increase Strength and Stamina Yes       Intervention Provide advice, education, support and counseling about physical activity/exercise needs.;Develop an individualized exercise prescription for aerobic and resistive training based on initial evaluation findings, risk stratification, comorbidities and participant's personal goals.       Expected Outcomes Short Term: Increase workloads from initial exercise prescription for resistance, speed, and METs.;Short Term: Perform resistance training exercises routinely during rehab and add in resistance training at home;Long Term: Improve cardiorespiratory fitness, muscular endurance and strength as measured by increased METs and functional capacity ( )       Able to understand and use rate of perceived exertion (RPE) scale Yes       Intervention Provide education and explanation on how to use RPE scale       Expected Outcomes Short Term: Able to use RPE daily in rehab to express subjective intensity level;Long Term:  Able to use RPE to guide intensity level when exercising independently       Knowledge and understanding of Target Heart Rate Range (THRR) Yes       Intervention Provide education and explanation of THRR including how the numbers were predicted and where they are located for reference       Expected Outcomes Short Term: Able to state/look up THRR;Long Term: Able to use THRR to govern intensity when exercising independently;Short Term:  Able to use daily as guideline for intensity in rehab       Able  to check pulse independently Yes       Intervention Provide education and demonstration on how to check pulse in carotid and radial arteries.;Review the importance of being able to check your own pulse for safety during independent exercise       Expected Outcomes Short Term: Able to explain why pulse checking is important during independent exercise;Long Term: Able to check pulse independently and accurately       Understanding of Exercise Prescription Yes       Intervention Provide education, explanation, and written materials on patient's individual exercise prescription       Expected Outcomes Short Term: Able to explain program exercise prescription;Long Term: Able to explain home exercise prescription to exercise independently              Exercise Goals Re-Evaluation:   Nutrition & Weight - Outcomes:    Nutrition:  Nutrition Therapy & Goals - 04/11/20 1505      Personal Nutrition Goals   Comments Patient scored 40 on her medficts diet assessment score. She eats alone and says she trys to eat heart healthy. Will continue to monitor.      Intervention Plan   Intervention Nutrition handout(s) given to patient.           Nutrition Discharge:  Nutrition Assessments - 04/11/20 1515      MEDFICTS Scores   Pre Score 40           Education Questionnaire Score:  Knowledge Questionnaire Score - 04/11/20 1506      Knowledge Questionnaire Score   Pre Score 20/24          Patient stopped attending 04/20/20 due to co-payment. She attended 3 sessions. She says she is going to join silver sne[employer]. MD will be notified.

## 2020-04-25 ENCOUNTER — Encounter (HOSPITAL_COMMUNITY): Payer: Medicare PPO

## 2020-04-27 ENCOUNTER — Encounter (HOSPITAL_COMMUNITY): Payer: Medicare PPO

## 2020-05-02 ENCOUNTER — Encounter (HOSPITAL_COMMUNITY): Payer: Medicare PPO

## 2020-05-04 ENCOUNTER — Encounter (HOSPITAL_COMMUNITY): Payer: Medicare PPO

## 2020-05-09 ENCOUNTER — Encounter (HOSPITAL_COMMUNITY): Payer: Medicare PPO

## 2020-05-11 ENCOUNTER — Encounter (HOSPITAL_COMMUNITY): Payer: Medicare PPO

## 2020-05-16 ENCOUNTER — Encounter (HOSPITAL_COMMUNITY): Payer: Medicare PPO

## 2020-05-18 ENCOUNTER — Encounter (HOSPITAL_COMMUNITY): Payer: Medicare PPO

## 2020-05-23 ENCOUNTER — Encounter (HOSPITAL_COMMUNITY): Payer: Medicare PPO

## 2020-05-25 ENCOUNTER — Encounter (HOSPITAL_COMMUNITY): Payer: Medicare PPO

## 2020-05-28 ENCOUNTER — Other Ambulatory Visit: Payer: Self-pay

## 2020-05-28 ENCOUNTER — Encounter: Payer: Self-pay | Admitting: Neurology

## 2020-05-28 ENCOUNTER — Encounter: Payer: Self-pay | Admitting: *Deleted

## 2020-05-28 ENCOUNTER — Ambulatory Visit: Payer: Medicare PPO | Admitting: Neurology

## 2020-05-28 VITALS — BP 136/85 | HR 77 | Ht 62.0 in | Wt 162.0 lb

## 2020-05-28 DIAGNOSIS — G43009 Migraine without aura, not intractable, without status migrainosus: Secondary | ICD-10-CM

## 2020-05-28 DIAGNOSIS — G44209 Tension-type headache, unspecified, not intractable: Secondary | ICD-10-CM

## 2020-05-28 MED ORDER — NURTEC 75 MG PO TBDP: 75.0000 mg | ORAL_TABLET | Freq: Every day | ORAL | 6 refills | Status: DC | PRN

## 2020-05-28 NOTE — Progress Notes (Addendum)
[facility]    Provider:  Dr Jaynee Eagles Requesting Provider: Danice Goltz* Primary Care Provider:  Adaline Sill, NP  CC:  migraine  HPI:  Maria George is a 79 y.o. female here as requested by Danice Goltz* for migraine.  Past medical history hypertension, vitamin D deficiency, neuropathy, depression with anxiety, insomnia, chronic pain, sciatica of the left side, pulmonary hypertension, hyperlipidemia, degenerative joint disease, coronary artery disease history of STEMI with a stent.  I reviewed notes from [place]: Patient with migraines remotely but has had resurgent, in the past 2 months following hospitalization for STEMI, developed left frontotemporal throbbing, Tylenol without relief, denied loss of vision or numbness or weakness, CT of the head was reportedly without acute event or abnormality with some small vessel ischemic disease per notes, diagnosed with migraine headaches visit to the hospital was March 03, 2020.  I reviewed lab work for that same day which included unremarkable CBC, CMP (BUN 17 and creatinine 0.9, slightly elevated alk phos at 136 and AST at 45 and chloride at 109).  I also reviewed notes from Dr. Manuella Ghazi, she presented at her last visit March 21, 2020 with blurry vision in the right and left eye, she discussed her severe migraine in July, this is the first when she is had and 35 years, felt like she was seeing shadows and little dots, I reviewed Dr. Trena Platt notes, patient is on aspirin and Brilinta daily, OD 20/20, OS 20/30, pupils normal, open angles, visual fields full, extraocular movements full, slit-lamp appears unremarkable, fundus exam appears unremarkable with flat sharp and good color optic nerves.  Pressure was consistent with glaucoma bilaterally and she was asked to continue meds and follow-up.  Here with her daughter who provides information as well. She has had migraines for over 30  years. She hadn't had one for many years. She had to go to the hospital in the past as well. They stopped but since her heart attack she has had more migraines. She has also had tension headaches. They are on the left, pulsating/ pounding/ throbbing, she saw lights in the left eye and numbers on the wall, she had to go to the hospital (July of this year, see above), she has had the eye symptoms in both eyes, getting laser on the left eye, she has had more migraines since July. Prior to the migraine and heart attack she has had had headaches over the years. Ibuprofen worked well. But then in July she had a severe migraine. She takes excedrin now for headaches. She has a headaches once a week. Excedrin helps, she has never taken more than 2 for a migraine or headaches and she tries not to let it get too extensive. She hasn't complained a lot about headaches. Daughter says she has them more. More tension type, she worries a lot, she worries about everything. Effexor is not helping and starting vibriid. No other focal neurologic deficits, associated symptoms, inciting events or modifiable factors.  Reviewed notes, labs and imaging from outside physicians, which showed:  Meds tried that can be used in migraine management include: Tylenol, norvasc, flexeril, gabapentin, ibuprofen, lisinopril, robaxin, reglan, aleve, zofran, venlafaxine,   I reviewed CT of the head report from July of this year: No acute intracranial abnormality identified, moderate small vessel ischemic disease changes.  Review of Systems: Patient complains of symptoms per HPI as well as the following symptoms: headaches. Pertinent negatives and positives per HPI. All others  negative.   Social History   Socioeconomic History  . Marital status: Divorced    Spouse name: Not on file  . Number of children: Not on file  . Years of education: Not on file  . Highest education level: Not on file  Occupational History  . Not on file  Tobacco  Use  . Smoking status: Former Smoker    Packs/day: 0.25    Years: 10.00    Pack years: 2.50    Quit date: 08/18/1996    Years since quitting: 23.8  . Smokeless tobacco: Never Used  Substance and Sexual Activity  . Alcohol use: No  . Drug use: Never  . Sexual activity: Not on file  Other Topics Concern  . Not on file  Social History Narrative   Lives alone   Caffeine: "little"   Social Determinants of Health   Financial Resource Strain:   . Difficulty of Paying Living Expenses: Not on file  Food Insecurity:   . Worried About Charity fundraiser in the Last Year: Not on file  . Ran Out of Food in the Last Year: Not on file  Transportation Needs:   . Lack of Transportation (Medical): Not on file  . Lack of Transportation (Non-Medical): Not on file  Physical Activity:   . Days of Exercise per Week: Not on file  . Minutes of Exercise per Session: Not on file  Stress:   . Feeling of Stress : Not on file  Social Connections:   . Frequency of Communication with Friends and Family: Not on file  . Frequency of Social Gatherings with Friends and Family: Not on file  . Attends Religious Services: Not on file  . Active Member of Clubs or Organizations: Not on file  . Attends Archivist Meetings: Not on file  . Marital Status: Not on file  Intimate Partner Violence:   . Fear of Current or Ex-Partner: Not on file  . Emotionally Abused: Not on file  . Physically Abused: Not on file  . Sexually Abused: Not on file    Family History  Problem Relation Age of Onset  . Cancer Mother        Breast CA  . Heart attack Father   . Cancer Maternal Grandmother        Breast CA  . Heart failure Sister   . Cancer Brother   . Heart Problems Brother     Past Medical History:  Diagnosis Date  . Anxiety   . Arthritis   . Depression   . GERD (gastroesophageal reflux disease)    otc  . Heart attack ([place]) 12/2019  . Heart disease   . High cholesterol   . Hypertension   .  Migraine    has been yrs.  . Osteoporosis     Patient Active Problem List   Diagnosis Date Noted  . Tension headache 05/28/2020  . Migraine without aura and without status migrainosus, not intractable 05/28/2020  . ARF (acute renal failure) ([place]) 09/21/2018  . Nausea vomiting and diarrhea 09/21/2018  . Acute renal failure (ARF) ([place]) 09/21/2018  . Essential hypertension 06/28/2014  . Absent pedal pulses 06/28/2014  . Hyperlipidemia 06/28/2014  . OA (osteoarthritis) of knee 09/17/2011    Past Surgical History:  Procedure Laterality Date  . ABDOMINAL HYSTERECTOMY  1974  . arthrscopy bilateral shoulder  yrs ago   right x 1, left x 1  . bilateral rotator cuff repair  2003  . breast biopsy bilateral  yrs ago  . CERVICAL LAMINECTOMY  1980's  . EYE SURGERY    . FOOT SURGERY     x 2  . JOINT REPLACEMENT  04/2012   Left Knee  . KNEE ARTHROSCOPY  jan 2012   bilateral knees  . lower back surgery  1990's  . NECK SURGERY    . rectocle and cystocele repair  late 1980's  . REVERSE SHOULDER ARTHROPLASTY Right 07/08/2013   Procedure: REVERSE RIGHT SHOULDER ARTHROPLASTY;  Surgeon: Augustin Schooling, MD;  Location: [place];  Service: Orthopedics;  Laterality: Right;  . TONSILLECTOMY  1970's  . TOTAL KNEE ARTHROPLASTY  sept 2012  . TOTAL KNEE ARTHROPLASTY  09/17/2011   Procedure: TOTAL KNEE ARTHROPLASTY;  Surgeon: Gearlean Alf, MD;  Location: [facility];  Service: Orthopedics;  Laterality: Right;    Current Outpatient Medications  Medication Sig Dispense Refill  . aspirin EC 81 MG tablet Take 81 mg by mouth daily. Swallow whole.    Marland Kitchen atorvastatin (LIPITOR) 80 MG tablet Take 80 mg by mouth daily.    . cetirizine (ZYRTEC) 10 MG tablet Take 10 mg by mouth daily.    . clopidogrel (PLAVIX) 75 MG tablet Take 75 mg by mouth daily.    Marland Kitchen estradiol (ESTRACE) 1 MG tablet Take 1 mg by mouth daily.    Marland Kitchen lisinopril (ZESTRIL) 5 MG tablet Take 2.5 mg by mouth 2 (two) times daily.     . metoprolol succinate  (TOPROL-XL) 25 MG 24 hr tablet Take 25 mg by mouth daily.    . montelukast (SINGULAIR) 10 MG tablet Take 10 mg by mouth at bedtime.    Marland Kitchen venlafaxine (EFFEXOR-XR) 150 MG 24 hr capsule Take 150 mg by mouth daily with breakfast.     . zolpidem (AMBIEN) 10 MG tablet Take 10 mg by mouth at bedtime as needed for sleep.     Marland Kitchen loperamide (IMODIUM) 2 MG capsule Take 2 mg by mouth as needed for diarrhea or loose stools.    . ondansetron (ZOFRAN) 4 MG tablet Take 4 mg by mouth daily as needed for nausea or vomiting.    Marland Kitchen Ubrogepant (UBRELVY) 100 MG TABS Take 100 mg by mouth every 2 (two) hours as needed. Maximum 296m a day. 8 tablet 0   No current facility-administered medications for this visit.    Allergies as of 05/28/2020 - Review Complete 05/28/2020  Allergen Reaction Noted  . Other  05/28/2020  . Peanut-containing drug products Swelling 12/22/2015    Vitals: BP 136/85 (BP Location: Right Arm, Patient Position: Sitting)   Pulse 77   Ht '5\' 2"'  (1.575 m)   Wt 162 lb (73.5 kg)   BMI 29.63 kg/m  Last Weight:  Wt Readings from Last 1 Encounters:  05/28/20 162 lb (73.5 kg)   Last Height:   Ht Readings from Last 1 Encounters:  05/28/20 '5\' 2"'  (1.575 m)     Physical exam: Exam: Gen: NAD, conversant                     CV: RRR, no MRG. No Carotid Bruits. No peripheral edema, warm, nontender Eyes: Conjunctivae clear without exudates or hemorrhage  Neuro: Detailed Neurologic Exam  Speech:    Speech is normal; fluent and spontaneous with normal comprehension.  Cognition:    The patient is oriented to person, place, and time;     recent and remote memory intact;     language fluent;     normal attention, concentration,  fund of knowledge Cranial Nerves:    The pupils are equal, round, and reactive to light. The fundi are flat. Visual fields are full to finger confrontation. Extraocular movements are intact. Trigeminal sensation is intact and the muscles of mastication are normal.  The face is symmetric. The palate elevates in the midline. Hearing intact. Voice is normal. Shoulder shrug is normal. The tongue has normal motion without fasciculations.   Coordination:    No dysmetria or ataxia  Gait:   Walks with a cane, not ataxic   Motor Observation:    No asymmetry, no atrophy, and no involuntary movements noted. Tone:    Normal muscle tone.    Posture:    Posture is normal. normal erect    Strength:    Poor effort but appears intact and non focal.       Sensation: intact to LT     Reflex Exam:  DTR's:    Deep tendon reflexes in the upper and lower extremities are symmetrical bilaterally.   Toes:    The toes are equivocal bilaterally.   Clonus:    Clonus is absent.    Assessment/Plan:  79 y.o. female here as requested by Danice Goltz* for migraine.  Past medical history hypertension, vitamin D deficiency, neuropathy, depression with anxiety, insomnia, chronic pain, sciatica of the left side, pulmonary hypertension, hyperlipidemia, degenerative joint disease, coronary artery disease history of STEMI with a stent. She is a poor historian, appears she is having headaches multiple times a week. She also has mood disorder doesn't appear well controlled.   Addendum: Nurtec too expensive, will try Ubrelvy  I discussed preventative vs acute.  We had a long discussion, it does appear that there is a significant overlap of mood disorder, anxiety, she is worrying, she has lots of stressors in her life, family stressors and personal medical stressors.  They are seeing someone in their venlafaxine is going to be changed soon which may help, I discussed all these things may be contributing to what sounds like frequent tension type headaches and a recent migraine.  We had a long discussion about preventative care, acute management, their options, how some of these things in her life may be affecting her.  At this time they do not want to take a preventative.  triptans contraindicated will try Nurtec as needed.  I will see patient back in 3 months and asked him to email me, we will try to get Nurtec approved through her insurance, I will personally see patient back in 3 months and I gave her extra time in our office appointment she is quite lovely and I think that at this time her headaches are due to stress and worry and anxiety.  She loves pecan pie from [place].  Discussed: To prevent or relieve headaches, try the following: Cool Compress. Lie down and place a cool compress on your head.  Avoid headache triggers. If certain foods or odors seem to have triggered your migraines in the past, avoid them. A headache diary might help you identify triggers.  Include physical activity in your daily routine. Try a daily walk or other moderate aerobic exercise.  Manage stress. Find healthy ways to cope with the stressors, such as delegating tasks on your to-do list.  Practice relaxation techniques. Try deep breathing, yoga, massage and visualization.  Eat regularly. Eating regularly scheduled meals and maintaining a healthy diet might help prevent headaches. Also, drink plenty of fluids.  Follow a regular sleep schedule. Sleep deprivation might  contribute to headaches Consider biofeedback. With this mind-body technique, you learn to control certain bodily functions -- such as muscle tension, heart rate and blood pressure -- to prevent headaches or reduce headache pain.    Proceed to emergency room if you experience new or worsening symptoms or symptoms do not resolve, if you have new neurologic symptoms or if headache is severe, or for any concerning symptom.   Provided education and documentation from American headache Society toolbox including articles on: chronic migraine medication overuse headache, chronic migraines, prevention of migraines, behavioral and other nonpharmacologic treatments for headache.   No orders of the defined types were placed in  this encounter.  Meds ordered this encounter  Medications  . DISCONTD: Rimegepant Sulfate (NURTEC) 75 MG TBDP    Sig: Take 75 mg by mouth daily as needed. For migraines. Take as close to onset of migraine as possible. One daily maximum.    Dispense:  10 tablet    Refill:  6  . Ubrogepant (UBRELVY) 100 MG TABS    Sig: Take 100 mg by mouth every 2 (two) hours as needed. Maximum 239m a day.    Dispense:  8 tablet    Refill:  0    Cc: SGarald Balding NP  ASarina Ill MD  [facility] [address]SS[address] [place]uxbury[place]iverside [place] [address] Phone [phone] [phone] I spent over 70 minutes of face-to-face and non-face-to-face time with patient on the  1. Migraine without aura and without status migrainosus, not intractable   2. Tension headache    diagnosis.  This included previsit chart review, lab review, study review, order entry, electronic health record documentation, patient education on the different diagnostic and therapeutic options, counseling and coordination of care, risks and benefits of management, compliance, or risk factor reduction

## 2020-05-28 NOTE — Progress Notes (Signed)
Notes from [facility].

## 2020-05-28 NOTE — Patient Instructions (Signed)
Nurtec once daily as needed  Rimegepant oral dissolving tablet What is this medicine? RIMEGEPANT (ri ME je pant) is used to treat migraine headaches with or without aura. An aura is a strange feeling or visual disturbance that warns you of an attack. It is not used to prevent migraines. This medicine may be used for other purposes; ask your health care provider or pharmacist if you have questions. COMMON BRAND NAME(S): NURTEC ODT What should I tell my health care provider before I take this medicine? They need to know if you have any of these conditions:  kidney disease  liver disease  an unusual or allergic reaction to rimegepant, other medicines, foods, dyes, or preservatives  pregnant or trying to get pregnant  breast-feeding How should I use this medicine? Take the medicine by mouth. Follow the directions on the prescription label. Leave the tablet in the sealed blister pack until you are ready to take it. With dry hands, open the blister and gently remove the tablet. If the tablet breaks or crumbles, throw it away and take a new tablet out of the blister pack. Place the tablet in the mouth and allow it to dissolve, and then swallow. Do not cut, crush, or chew this medicine. You do not need water to take this medicine. Talk to your pediatrician about the use of this medicine in children. Special care may be needed. Overdosage: If you think you have taken too much of this medicine contact a poison control center or emergency room at once. NOTE: This medicine is only for you. Do not share this medicine with others. What if I miss a dose? This does not apply. This medicine is not for regular use. What may interact with this medicine? This medicine may interact with the following medications:  certain medicines for fungal infections like fluconazole, itraconazole  rifampin This list may not describe all possible interactions. Give your health care provider a list of all the medicines,  herbs, non-prescription drugs, or dietary supplements you use. Also tell them if you smoke, drink alcohol, or use illegal drugs. Some items may interact with your medicine. What should I watch for while using this medicine? Visit your health care professional for regular checks on your progress. Tell your health care professional if your symptoms do not start to get better or if they get worse. What side effects may I notice from receiving this medicine? Side effects that you should report to your doctor or health care professional as soon as possible:  allergic reactions like skin rash, itching or hives; swelling of the face, lips, or tongue Side effects that usually do not require medical attention (report these to your doctor or health care professional if they continue or are bothersome):  nausea This list may not describe all possible side effects. Call your doctor for medical advice about side effects. You may report side effects to FDA at 1-800-FDA-1088. Where should I keep my medicine? Keep out of the reach of children. Store at room temperature between 15 and 30 degrees C (59 and 86 degrees F). Throw away any unused medicine after the expiration date. NOTE: This sheet is a summary. It may not cover all possible information. If you have questions about this medicine, talk to your doctor, pharmacist, or health care provider.  2020 Elsevier/Gold Standard (2018-10-18 00:21:31)

## 2020-05-29 ENCOUNTER — Telehealth: Payer: Self-pay | Admitting: Neurology

## 2020-05-29 NOTE — Telephone Encounter (Signed)
PA submitted for the patient for Nurtec through cover my meds/ [employer]. KEY: [id number] Will wait for response

## 2020-05-30 ENCOUNTER — Encounter (HOSPITAL_COMMUNITY): Payer: Medicare PPO

## 2020-05-30 NOTE — Telephone Encounter (Signed)
Received approval from [facility] Nurtec 75 mg approved through 08/17/2021. I faxed the approval notice to pt's pharmacy. Received a receipt of confirmation.

## 2020-06-01 ENCOUNTER — Encounter (HOSPITAL_COMMUNITY): Payer: Medicare PPO

## 2020-06-06 ENCOUNTER — Encounter (HOSPITAL_COMMUNITY): Payer: Medicare PPO

## 2020-06-08 ENCOUNTER — Encounter (HOSPITAL_COMMUNITY): Payer: Medicare PPO

## 2020-06-12 ENCOUNTER — Telehealth: Payer: Self-pay | Admitting: Neurology

## 2020-06-12 NOTE — Telephone Encounter (Signed)
Pt called wanting to inform RN that the Nurtec is covered by her insurance but her copay would be $100 and she can not afford that. Pt would like to know if there is something else she can try. Please advise.

## 2020-06-12 NOTE — Telephone Encounter (Signed)
Thanks, looks like this is her second call in the same few hours. Please try and remember to let patients know it takes 24-48 hours to return calls we are not an urgent call center. Thanks,

## 2020-06-12 NOTE — Telephone Encounter (Signed)
Pt has called again stating she will not be able to afford the $100.00 copay for the Nurtec.  Pt would like a call re: what other medication Dr Lucia Gaskins could suggest.

## 2020-06-13 ENCOUNTER — Encounter (HOSPITAL_COMMUNITY): Payer: Medicare PPO

## 2020-06-13 MED ORDER — UBRELVY 100 MG PO TABS: 100.0000 mg | ORAL_TABLET | ORAL | 0 refills | Status: AC | PRN

## 2020-06-13 NOTE — Addendum Note (Signed)
Addended by: Naomie Dean B on: 06/13/2020 01:52 PM   Modules accepted: Orders

## 2020-06-13 NOTE — Telephone Encounter (Signed)
Going to try and see if Bernita Raisin is better cost wise, sent it in we will see thanks

## 2020-06-13 NOTE — Telephone Encounter (Signed)
I spoke with pt's daughter Algis Downs (on DPR) and advised of plan to try Bernita Raisin, Nurtec's sister drug, to see if  this is more cost effective. I advised the pharmacy will be in touch with next steps I.e. prior authorization if needed. She verbalized appreciation for the call.

## 2020-06-14 NOTE — Telephone Encounter (Signed)
Pt called, prescription sent to the pharmacy also has a $100 copay. Could you prescribe something else? Would like a call from the nurse.

## 2020-06-15 ENCOUNTER — Encounter (HOSPITAL_COMMUNITY): Payer: Medicare PPO

## 2020-06-19 NOTE — Telephone Encounter (Signed)
Nurtec PA completed on Cover My Meds. Key: [id number]. Awaiting determination from [facility].

## 2020-06-21 NOTE — Telephone Encounter (Signed)
PA Case: [id number], Status: Approved, Coverage Starts on: 08/19/2019 12:00:00 AM, Coverage Ends on: 08/17/2021 12:00:00 AM. Questions? Contact [phone].   I called [employer]. They were able to get a paid claim and the copay was unchanged at $100.

## 2020-06-21 NOTE — Telephone Encounter (Signed)
[facility] has a financial support program called [employer] that pt can apply for. I am unable to find any support for Nurtec. I called the pt and LVM (ok per DPR) advising her of this and asked for her to call us back and let us know if she would like to apply for assistance. She would need to submit proof of income and insurance information. Alternatively, I could ask Dr Lucia Gaskins if there is another medication we could try. Triptans are not recommended however.

## 2020-08-30 ENCOUNTER — Ambulatory Visit: Payer: Medicare PPO | Admitting: Neurology

## 2021-04-01 IMAGING — CT CT OF THE RIGHT FOOT WITHOUT CONTRAST
3 series · 13 of 36 positions shown, 14 images · non-contrast
Comparison: None.

CLINICAL DATA: Right foot pain and swelling. History of
bunionectomy 09/14/2018.

EXAM:
CT OF THE RIGHT FOOT WITHOUT CONTRAST
TECHNIQUE: Multidetector CT imaging of the right foot was performed according
to the standard protocol. Multiplanar CT image reconstructions were
also generated.

[Series 4: soft tissue lower extremity · axial · 0.53mm/px · z∈[-224,-124]mm · 4 of 72 slices shown, 5 images]
[im 11/72  soft-tissue]
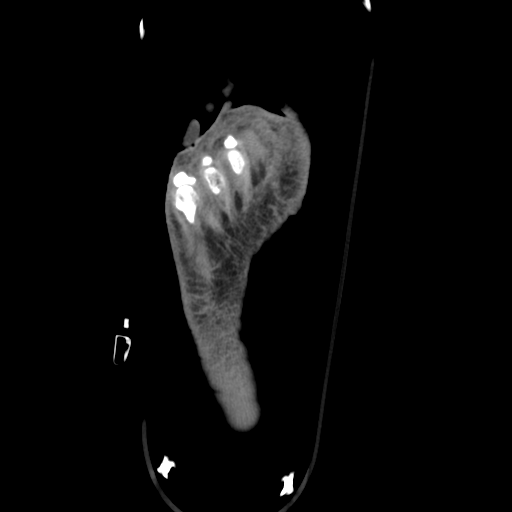
[im 11/72  bone]
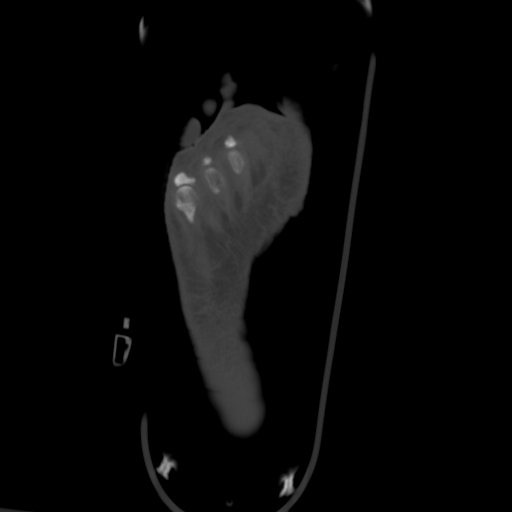
[im 28/72  bone]
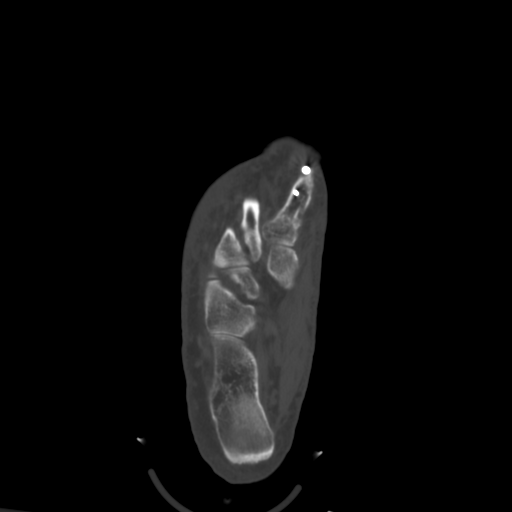
[im 44/72  bone]
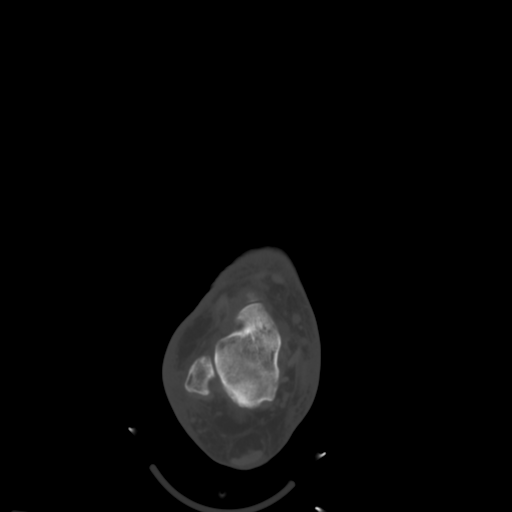
[im 61/72  bone]
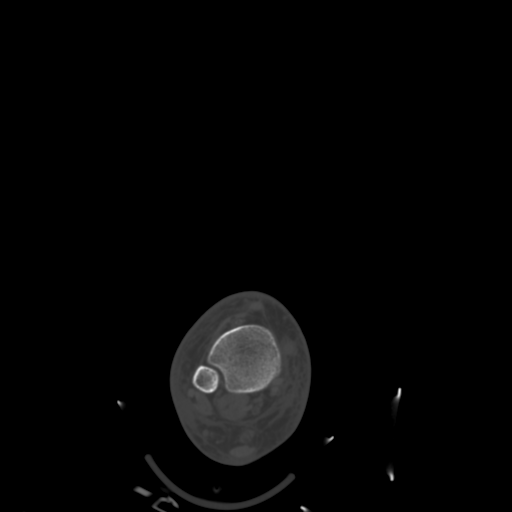

[Series 9: cor soft tissue · coronal · 0.29mm/px · 3 of 125 slices shown]
[im 25/125  bone]
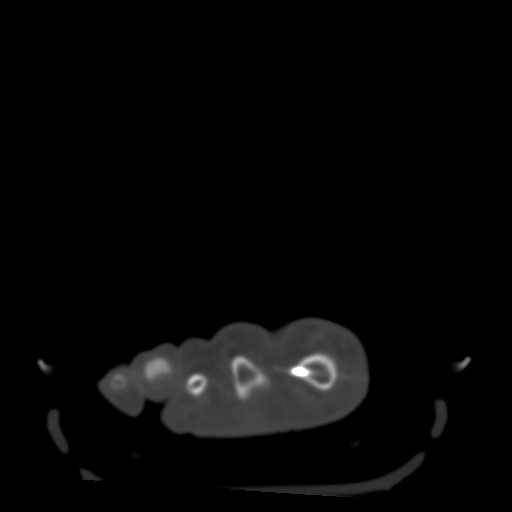
[im 50/125  bone]
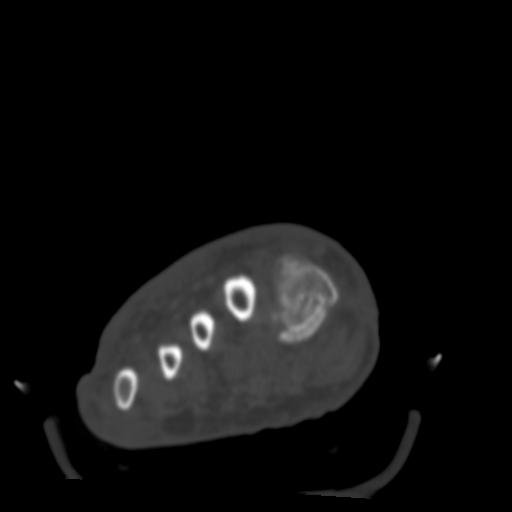
[im 75/125  bone]
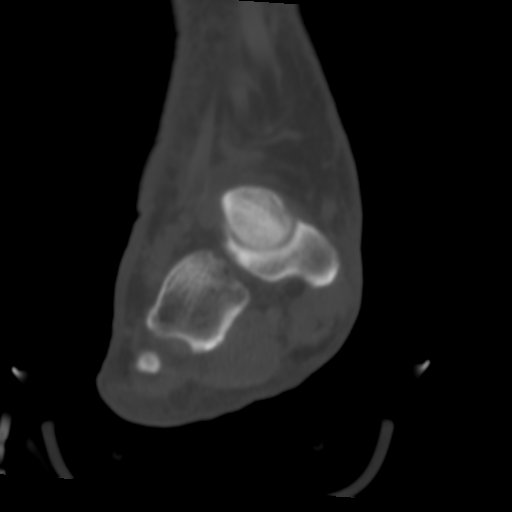

[Series 10: sagsoft tissue · sagittal · 0.29mm/px · 6 of 58 slices shown]
[im 29/58  soft-tissue]
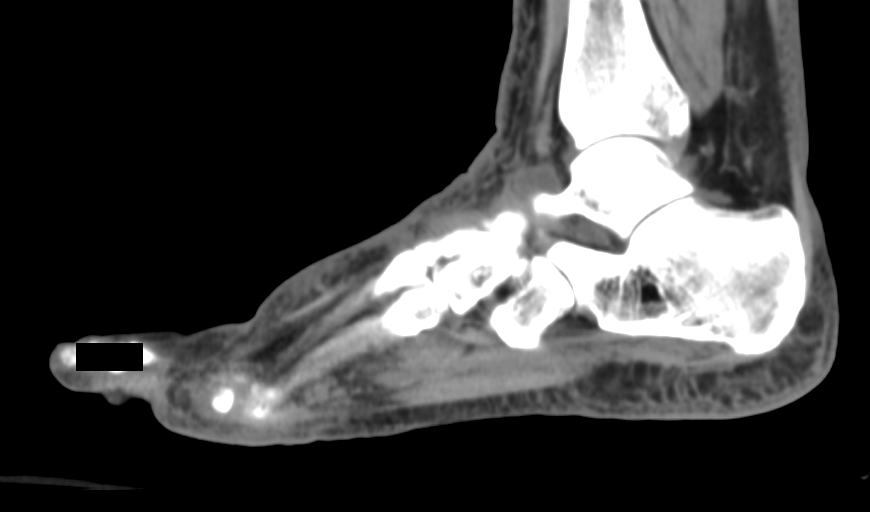
[im 37/58  bone]
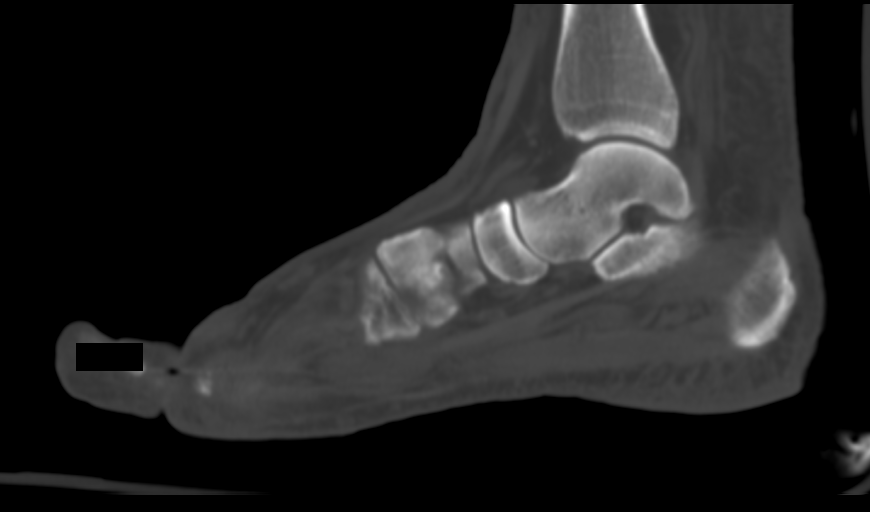
[im 40/58  bone]
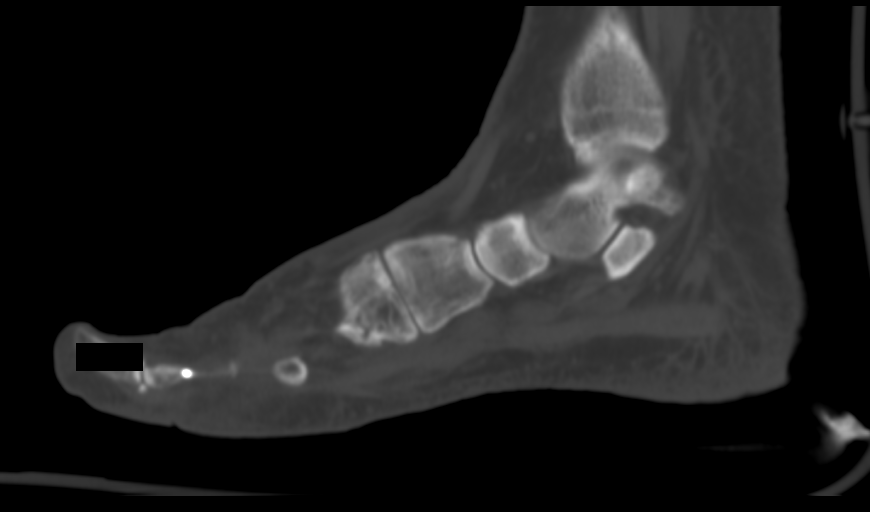
[im 42/58  bone]
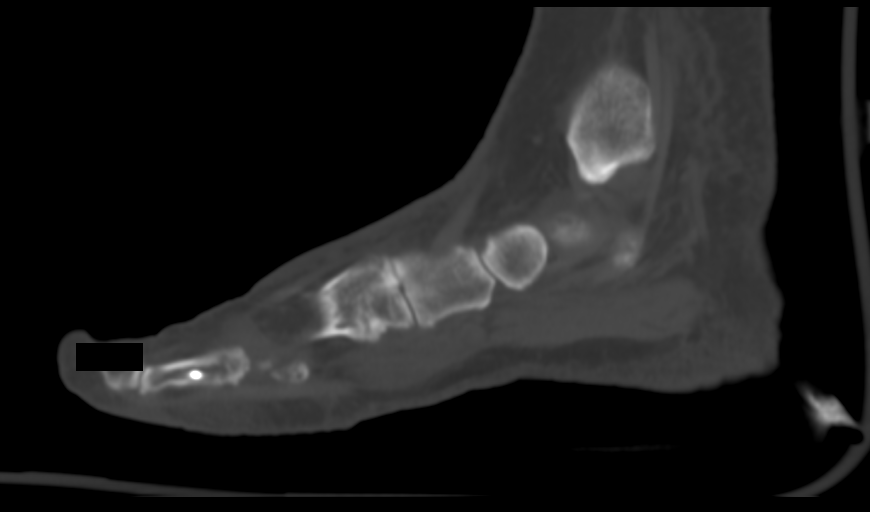
[im 44/58  bone]
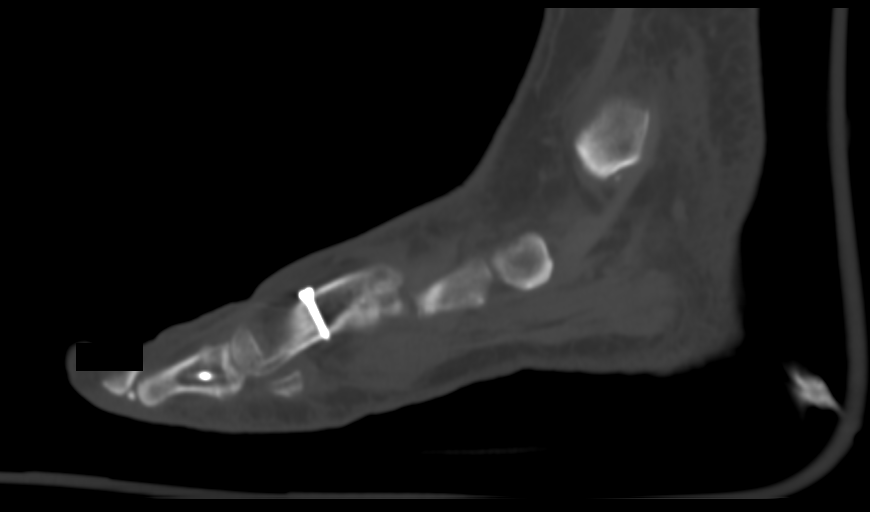
[im 47/58  bone]
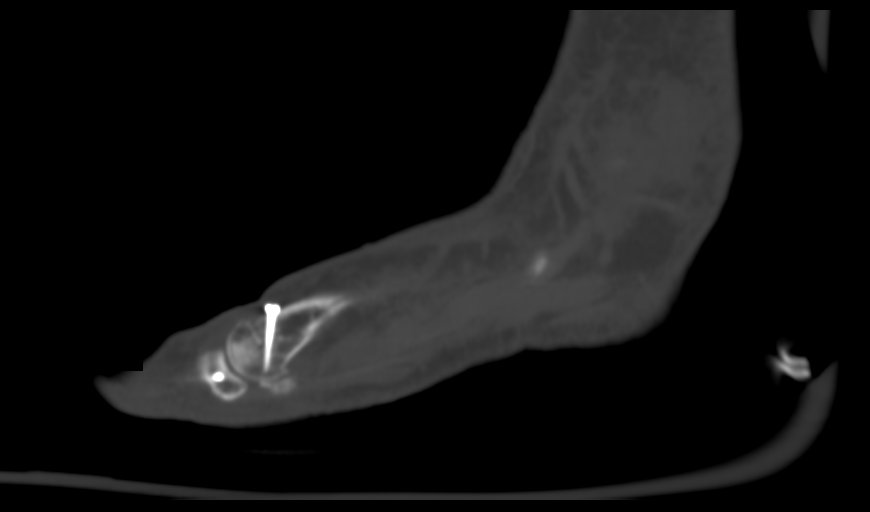

[13 of 36 positions shown; findings below may reference images not displayed]

FINDINGS: Bones/Joint/Cartilage

No fracture or dislocation. Normal alignment. No joint effusion.

Nearly completely first metatarsal osteotomy with a small ununited
cleft along the proximal plantar aspect. Healed osteotomy at the
base of the first proximal phalanx with orthopedic hardware in
place. Moderate osteoarthritis of the first MTP joint.

Hammertoe deformity correction involving the second MTP joint.

No periosteal reaction or bone destruction.

Ligaments

Ligaments are suboptimally evaluated by CT.

Muscles and Tendons
Muscles are normal. No muscle atrophy. Flexor, extensor, peroneal
and Achilles tendons are intact.

Soft tissue
No fluid collection or hematoma. No soft tissue mass. Mild
nonspecific soft tissue edema around the ankle.
IMPRESSION: 1. Nearly completely first metatarsal osteotomy with a small
ununited cleft along the proximal plantar aspect. Healed osteotomy
at the base of the first proximal phalanx with orthopedic hardware
in place. Moderate osteoarthritis of the first MTP joint.
2. Hammertoe deformity correction involving the second MTP joint.
3.  No acute osseous injury of the right foot.
